# Patient Record
Sex: Female | Born: 1945 | Race: White | Hispanic: No | Marital: Married | Smoking: Never smoker
Health system: Southern US, Community
[De-identification: ages and names within clinical notes are randomized; demographics above are authoritative.]

## PROBLEM LIST (undated history)

## (undated) DIAGNOSIS — N390 Urinary tract infection, site not specified: Secondary | ICD-10-CM

## (undated) DIAGNOSIS — E119 Type 2 diabetes mellitus without complications: Secondary | ICD-10-CM

## (undated) DIAGNOSIS — H3530 Unspecified macular degeneration: Secondary | ICD-10-CM

## (undated) DIAGNOSIS — N200 Calculus of kidney: Secondary | ICD-10-CM

## (undated) DIAGNOSIS — R011 Cardiac murmur, unspecified: Secondary | ICD-10-CM

## (undated) HISTORY — PX: LITHOTRIPSY: SUR834

## (undated) HISTORY — PX: OTHER SURGICAL HISTORY: SHX169

## (undated) HISTORY — DX: Unspecified macular degeneration: H35.30

## (undated) HISTORY — DX: Urinary tract infection, site not specified: N39.0

## (undated) HISTORY — PX: ABDOMINAL HYSTERECTOMY: SHX81

## (undated) HISTORY — DX: Calculus of kidney: N20.0

## (undated) HISTORY — DX: Cardiac murmur, unspecified: R01.1

## (undated) HISTORY — DX: Type 2 diabetes mellitus without complications: E11.9

---

## 2014-05-27 DIAGNOSIS — R6520 Severe sepsis without septic shock: Secondary | ICD-10-CM | POA: Diagnosis not present

## 2014-05-27 DIAGNOSIS — I471 Supraventricular tachycardia: Secondary | ICD-10-CM | POA: Diagnosis not present

## 2014-05-27 DIAGNOSIS — I480 Paroxysmal atrial fibrillation: Secondary | ICD-10-CM | POA: Diagnosis not present

## 2014-05-27 DIAGNOSIS — R197 Diarrhea, unspecified: Secondary | ICD-10-CM | POA: Diagnosis not present

## 2014-05-27 DIAGNOSIS — I499 Cardiac arrhythmia, unspecified: Secondary | ICD-10-CM | POA: Diagnosis not present

## 2014-05-27 DIAGNOSIS — J189 Pneumonia, unspecified organism: Secondary | ICD-10-CM | POA: Diagnosis not present

## 2014-05-27 DIAGNOSIS — Z7901 Long term (current) use of anticoagulants: Secondary | ICD-10-CM | POA: Diagnosis not present

## 2014-05-27 DIAGNOSIS — E871 Hypo-osmolality and hyponatremia: Secondary | ICD-10-CM | POA: Diagnosis not present

## 2014-05-27 DIAGNOSIS — E872 Acidosis: Secondary | ICD-10-CM | POA: Diagnosis not present

## 2014-05-27 DIAGNOSIS — K529 Noninfective gastroenteritis and colitis, unspecified: Secondary | ICD-10-CM | POA: Diagnosis not present

## 2014-05-27 DIAGNOSIS — E860 Dehydration: Secondary | ICD-10-CM | POA: Diagnosis not present

## 2014-05-27 DIAGNOSIS — N179 Acute kidney failure, unspecified: Secondary | ICD-10-CM | POA: Diagnosis not present

## 2014-05-27 DIAGNOSIS — I483 Typical atrial flutter: Secondary | ICD-10-CM | POA: Diagnosis not present

## 2014-05-27 DIAGNOSIS — R0902 Hypoxemia: Secondary | ICD-10-CM | POA: Diagnosis not present

## 2014-05-27 DIAGNOSIS — A419 Sepsis, unspecified organism: Secondary | ICD-10-CM | POA: Diagnosis not present

## 2014-05-27 DIAGNOSIS — J188 Other pneumonia, unspecified organism: Secondary | ICD-10-CM | POA: Diagnosis not present

## 2014-05-27 DIAGNOSIS — R000 Tachycardia, unspecified: Secondary | ICD-10-CM | POA: Diagnosis not present

## 2014-05-27 DIAGNOSIS — I4891 Unspecified atrial fibrillation: Secondary | ICD-10-CM | POA: Diagnosis not present

## 2014-05-27 DIAGNOSIS — R1110 Vomiting, unspecified: Secondary | ICD-10-CM | POA: Diagnosis not present

## 2014-05-27 DIAGNOSIS — R112 Nausea with vomiting, unspecified: Secondary | ICD-10-CM | POA: Diagnosis not present

## 2014-05-27 DIAGNOSIS — I959 Hypotension, unspecified: Secondary | ICD-10-CM | POA: Diagnosis not present

## 2014-06-03 DIAGNOSIS — E871 Hypo-osmolality and hyponatremia: Secondary | ICD-10-CM | POA: Diagnosis not present

## 2014-06-03 DIAGNOSIS — N135 Crossing vessel and stricture of ureter without hydronephrosis: Secondary | ICD-10-CM | POA: Diagnosis not present

## 2014-06-03 DIAGNOSIS — I10 Essential (primary) hypertension: Secondary | ICD-10-CM | POA: Diagnosis not present

## 2014-06-03 DIAGNOSIS — N1330 Unspecified hydronephrosis: Secondary | ICD-10-CM | POA: Diagnosis not present

## 2014-06-03 DIAGNOSIS — K5289 Other specified noninfective gastroenteritis and colitis: Secondary | ICD-10-CM | POA: Diagnosis not present

## 2014-06-03 DIAGNOSIS — Z7901 Long term (current) use of anticoagulants: Secondary | ICD-10-CM | POA: Diagnosis not present

## 2014-06-03 DIAGNOSIS — M2550 Pain in unspecified joint: Secondary | ICD-10-CM | POA: Diagnosis not present

## 2014-06-03 DIAGNOSIS — N201 Calculus of ureter: Secondary | ICD-10-CM | POA: Diagnosis not present

## 2014-06-03 DIAGNOSIS — A419 Sepsis, unspecified organism: Secondary | ICD-10-CM | POA: Diagnosis not present

## 2014-06-03 DIAGNOSIS — Z87442 Personal history of urinary calculi: Secondary | ICD-10-CM | POA: Diagnosis not present

## 2014-06-03 DIAGNOSIS — I4891 Unspecified atrial fibrillation: Secondary | ICD-10-CM | POA: Diagnosis not present

## 2014-06-03 DIAGNOSIS — N179 Acute kidney failure, unspecified: Secondary | ICD-10-CM | POA: Diagnosis not present

## 2014-06-03 DIAGNOSIS — K529 Noninfective gastroenteritis and colitis, unspecified: Secondary | ICD-10-CM | POA: Diagnosis not present

## 2014-06-03 DIAGNOSIS — N132 Hydronephrosis with renal and ureteral calculous obstruction: Secondary | ICD-10-CM | POA: Diagnosis not present

## 2014-06-03 DIAGNOSIS — I5030 Unspecified diastolic (congestive) heart failure: Secondary | ICD-10-CM | POA: Diagnosis not present

## 2014-06-03 DIAGNOSIS — R351 Nocturia: Secondary | ICD-10-CM | POA: Diagnosis not present

## 2014-06-03 DIAGNOSIS — R197 Diarrhea, unspecified: Secondary | ICD-10-CM | POA: Diagnosis not present

## 2014-06-03 DIAGNOSIS — R1110 Vomiting, unspecified: Secondary | ICD-10-CM | POA: Diagnosis not present

## 2014-06-03 DIAGNOSIS — J3089 Other allergic rhinitis: Secondary | ICD-10-CM | POA: Diagnosis not present

## 2014-06-03 DIAGNOSIS — R509 Fever, unspecified: Secondary | ICD-10-CM | POA: Diagnosis not present

## 2014-06-03 DIAGNOSIS — R0600 Dyspnea, unspecified: Secondary | ICD-10-CM | POA: Diagnosis not present

## 2014-06-03 DIAGNOSIS — J189 Pneumonia, unspecified organism: Secondary | ICD-10-CM | POA: Diagnosis not present

## 2014-06-05 DIAGNOSIS — K5289 Other specified noninfective gastroenteritis and colitis: Secondary | ICD-10-CM | POA: Diagnosis not present

## 2014-06-05 DIAGNOSIS — A419 Sepsis, unspecified organism: Secondary | ICD-10-CM | POA: Diagnosis not present

## 2014-06-05 DIAGNOSIS — I4891 Unspecified atrial fibrillation: Secondary | ICD-10-CM | POA: Diagnosis not present

## 2014-06-05 DIAGNOSIS — R509 Fever, unspecified: Secondary | ICD-10-CM | POA: Diagnosis not present

## 2014-06-12 DIAGNOSIS — R0600 Dyspnea, unspecified: Secondary | ICD-10-CM | POA: Diagnosis not present

## 2014-06-12 DIAGNOSIS — I4891 Unspecified atrial fibrillation: Secondary | ICD-10-CM | POA: Diagnosis not present

## 2014-06-12 DIAGNOSIS — R509 Fever, unspecified: Secondary | ICD-10-CM | POA: Diagnosis not present

## 2014-06-12 DIAGNOSIS — J189 Pneumonia, unspecified organism: Secondary | ICD-10-CM | POA: Diagnosis not present

## 2014-06-14 DIAGNOSIS — I4891 Unspecified atrial fibrillation: Secondary | ICD-10-CM | POA: Diagnosis not present

## 2014-06-14 DIAGNOSIS — M2550 Pain in unspecified joint: Secondary | ICD-10-CM | POA: Diagnosis not present

## 2014-06-14 DIAGNOSIS — I10 Essential (primary) hypertension: Secondary | ICD-10-CM | POA: Diagnosis not present

## 2014-06-14 DIAGNOSIS — I5030 Unspecified diastolic (congestive) heart failure: Secondary | ICD-10-CM | POA: Diagnosis not present

## 2014-06-15 DIAGNOSIS — N201 Calculus of ureter: Secondary | ICD-10-CM | POA: Diagnosis not present

## 2014-06-15 DIAGNOSIS — R351 Nocturia: Secondary | ICD-10-CM | POA: Diagnosis not present

## 2014-06-15 DIAGNOSIS — N1330 Unspecified hydronephrosis: Secondary | ICD-10-CM | POA: Diagnosis not present

## 2014-06-16 DIAGNOSIS — R509 Fever, unspecified: Secondary | ICD-10-CM | POA: Diagnosis not present

## 2014-06-16 DIAGNOSIS — K529 Noninfective gastroenteritis and colitis, unspecified: Secondary | ICD-10-CM | POA: Diagnosis not present

## 2014-06-22 DIAGNOSIS — I4891 Unspecified atrial fibrillation: Secondary | ICD-10-CM | POA: Diagnosis not present

## 2014-06-22 DIAGNOSIS — Z7901 Long term (current) use of anticoagulants: Secondary | ICD-10-CM | POA: Diagnosis not present

## 2014-06-22 DIAGNOSIS — N132 Hydronephrosis with renal and ureteral calculous obstruction: Secondary | ICD-10-CM | POA: Diagnosis not present

## 2014-06-22 DIAGNOSIS — Z87442 Personal history of urinary calculi: Secondary | ICD-10-CM | POA: Diagnosis not present

## 2014-06-22 DIAGNOSIS — N135 Crossing vessel and stricture of ureter without hydronephrosis: Secondary | ICD-10-CM | POA: Diagnosis not present

## 2014-06-22 DIAGNOSIS — I10 Essential (primary) hypertension: Secondary | ICD-10-CM | POA: Diagnosis not present

## 2014-06-24 DIAGNOSIS — J3089 Other allergic rhinitis: Secondary | ICD-10-CM | POA: Diagnosis not present

## 2014-06-24 DIAGNOSIS — J189 Pneumonia, unspecified organism: Secondary | ICD-10-CM | POA: Diagnosis not present

## 2014-06-24 DIAGNOSIS — I4891 Unspecified atrial fibrillation: Secondary | ICD-10-CM | POA: Diagnosis not present

## 2014-06-24 DIAGNOSIS — K529 Noninfective gastroenteritis and colitis, unspecified: Secondary | ICD-10-CM | POA: Diagnosis not present

## 2014-06-29 DIAGNOSIS — R0602 Shortness of breath: Secondary | ICD-10-CM | POA: Diagnosis not present

## 2014-06-29 DIAGNOSIS — R079 Chest pain, unspecified: Secondary | ICD-10-CM | POA: Diagnosis not present

## 2014-06-29 DIAGNOSIS — I4891 Unspecified atrial fibrillation: Secondary | ICD-10-CM | POA: Diagnosis not present

## 2014-07-02 DIAGNOSIS — N1330 Unspecified hydronephrosis: Secondary | ICD-10-CM | POA: Diagnosis not present

## 2014-07-02 DIAGNOSIS — B952 Enterococcus as the cause of diseases classified elsewhere: Secondary | ICD-10-CM | POA: Diagnosis not present

## 2014-07-02 DIAGNOSIS — N201 Calculus of ureter: Secondary | ICD-10-CM | POA: Diagnosis not present

## 2014-07-02 DIAGNOSIS — N390 Urinary tract infection, site not specified: Secondary | ICD-10-CM | POA: Diagnosis not present

## 2014-07-02 DIAGNOSIS — N200 Calculus of kidney: Secondary | ICD-10-CM | POA: Diagnosis not present

## 2014-07-09 DIAGNOSIS — N200 Calculus of kidney: Secondary | ICD-10-CM | POA: Diagnosis not present

## 2014-07-13 DIAGNOSIS — R351 Nocturia: Secondary | ICD-10-CM | POA: Diagnosis not present

## 2014-07-21 DIAGNOSIS — Z888 Allergy status to other drugs, medicaments and biological substances status: Secondary | ICD-10-CM | POA: Diagnosis not present

## 2014-07-21 DIAGNOSIS — Z833 Family history of diabetes mellitus: Secondary | ICD-10-CM | POA: Diagnosis not present

## 2014-07-21 DIAGNOSIS — I10 Essential (primary) hypertension: Secondary | ICD-10-CM | POA: Diagnosis not present

## 2014-07-21 DIAGNOSIS — I4891 Unspecified atrial fibrillation: Secondary | ICD-10-CM | POA: Diagnosis not present

## 2014-07-21 DIAGNOSIS — Z79899 Other long term (current) drug therapy: Secondary | ICD-10-CM | POA: Diagnosis not present

## 2014-07-21 DIAGNOSIS — Z825 Family history of asthma and other chronic lower respiratory diseases: Secondary | ICD-10-CM | POA: Diagnosis not present

## 2014-07-21 DIAGNOSIS — N200 Calculus of kidney: Secondary | ICD-10-CM | POA: Diagnosis not present

## 2014-07-21 DIAGNOSIS — N201 Calculus of ureter: Secondary | ICD-10-CM | POA: Diagnosis not present

## 2014-07-21 DIAGNOSIS — Z87442 Personal history of urinary calculi: Secondary | ICD-10-CM | POA: Diagnosis not present

## 2014-08-05 DIAGNOSIS — N390 Urinary tract infection, site not specified: Secondary | ICD-10-CM | POA: Diagnosis not present

## 2014-08-05 DIAGNOSIS — B952 Enterococcus as the cause of diseases classified elsewhere: Secondary | ICD-10-CM | POA: Diagnosis not present

## 2014-08-05 DIAGNOSIS — N200 Calculus of kidney: Secondary | ICD-10-CM | POA: Diagnosis not present

## 2014-08-06 DIAGNOSIS — N200 Calculus of kidney: Secondary | ICD-10-CM | POA: Diagnosis not present

## 2014-08-13 DIAGNOSIS — N201 Calculus of ureter: Secondary | ICD-10-CM | POA: Diagnosis not present

## 2014-08-16 DIAGNOSIS — H2513 Age-related nuclear cataract, bilateral: Secondary | ICD-10-CM | POA: Diagnosis not present

## 2014-08-16 DIAGNOSIS — H53039 Strabismic amblyopia, unspecified eye: Secondary | ICD-10-CM | POA: Diagnosis not present

## 2014-08-16 DIAGNOSIS — H35363 Drusen (degenerative) of macula, bilateral: Secondary | ICD-10-CM | POA: Diagnosis not present

## 2014-08-31 DIAGNOSIS — N201 Calculus of ureter: Secondary | ICD-10-CM | POA: Diagnosis not present

## 2014-08-31 DIAGNOSIS — N1330 Unspecified hydronephrosis: Secondary | ICD-10-CM | POA: Diagnosis not present

## 2014-08-31 DIAGNOSIS — B952 Enterococcus as the cause of diseases classified elsewhere: Secondary | ICD-10-CM | POA: Diagnosis not present

## 2014-08-31 DIAGNOSIS — N390 Urinary tract infection, site not specified: Secondary | ICD-10-CM | POA: Diagnosis not present

## 2014-08-31 DIAGNOSIS — N200 Calculus of kidney: Secondary | ICD-10-CM | POA: Diagnosis not present

## 2014-09-10 DIAGNOSIS — I483 Typical atrial flutter: Secondary | ICD-10-CM | POA: Diagnosis not present

## 2014-09-13 DIAGNOSIS — B952 Enterococcus as the cause of diseases classified elsewhere: Secondary | ICD-10-CM | POA: Diagnosis not present

## 2014-09-13 DIAGNOSIS — N390 Urinary tract infection, site not specified: Secondary | ICD-10-CM | POA: Diagnosis not present

## 2014-09-15 DIAGNOSIS — Z883 Allergy status to other anti-infective agents status: Secondary | ICD-10-CM | POA: Diagnosis not present

## 2014-09-15 DIAGNOSIS — Z79899 Other long term (current) drug therapy: Secondary | ICD-10-CM | POA: Diagnosis not present

## 2014-09-15 DIAGNOSIS — N201 Calculus of ureter: Secondary | ICD-10-CM | POA: Diagnosis not present

## 2014-09-15 DIAGNOSIS — I4891 Unspecified atrial fibrillation: Secondary | ICD-10-CM | POA: Diagnosis not present

## 2014-09-15 DIAGNOSIS — Z7951 Long term (current) use of inhaled steroids: Secondary | ICD-10-CM | POA: Diagnosis not present

## 2014-09-15 DIAGNOSIS — N200 Calculus of kidney: Secondary | ICD-10-CM | POA: Diagnosis not present

## 2014-09-15 DIAGNOSIS — I10 Essential (primary) hypertension: Secondary | ICD-10-CM | POA: Diagnosis not present

## 2014-09-15 DIAGNOSIS — Z87442 Personal history of urinary calculi: Secondary | ICD-10-CM | POA: Diagnosis not present

## 2014-09-27 DIAGNOSIS — K8020 Calculus of gallbladder without cholecystitis without obstruction: Secondary | ICD-10-CM | POA: Diagnosis not present

## 2014-09-27 DIAGNOSIS — N200 Calculus of kidney: Secondary | ICD-10-CM | POA: Diagnosis not present

## 2014-09-27 DIAGNOSIS — I452 Bifascicular block: Secondary | ICD-10-CM | POA: Diagnosis not present

## 2014-09-27 LAB — LIPID PANEL
Cholesterol: 170 mg/dL (ref 0–200)
HDL: 61 mg/dL (ref 35–70)
LDL Cholesterol: 69 mg/dL
TRIGLYCERIDES: 198 mg/dL — AB (ref 40–160)

## 2014-09-27 LAB — BASIC METABOLIC PANEL
BUN: 17 mg/dL (ref 4–21)
Creatinine: 0.9 mg/dL (ref <=1.1)
GLUCOSE: 132 mg/dL
POTASSIUM: 4.6 mmol/L (ref 3.4–5.3)
SODIUM: 140 mmol/L (ref 137–147)

## 2014-09-27 LAB — HEPATIC FUNCTION PANEL
ALT: 24 U/L (ref 7–35)
AST: 16 U/L (ref 13–35)
Alkaline Phosphatase: 59 U/L (ref 25–125)
BILIRUBIN, TOTAL: 0.7 mg/dL

## 2014-09-27 LAB — CBC AND DIFFERENTIAL
HEMATOCRIT: 46 % (ref 36–46)
Hemoglobin: 15.2 g/dL (ref 12.0–16.0)
Neutrophils Absolute: 5 /uL
Platelets: 279 10*3/uL (ref 150–399)
WBC: 7.5 10^3/mL

## 2014-09-28 DIAGNOSIS — B952 Enterococcus as the cause of diseases classified elsewhere: Secondary | ICD-10-CM | POA: Diagnosis not present

## 2014-09-28 DIAGNOSIS — N201 Calculus of ureter: Secondary | ICD-10-CM | POA: Diagnosis not present

## 2014-09-28 DIAGNOSIS — N200 Calculus of kidney: Secondary | ICD-10-CM | POA: Diagnosis not present

## 2014-09-28 DIAGNOSIS — N390 Urinary tract infection, site not specified: Secondary | ICD-10-CM | POA: Diagnosis not present

## 2014-09-30 DIAGNOSIS — N390 Urinary tract infection, site not specified: Secondary | ICD-10-CM | POA: Diagnosis not present

## 2014-09-30 DIAGNOSIS — B952 Enterococcus as the cause of diseases classified elsewhere: Secondary | ICD-10-CM | POA: Diagnosis not present

## 2014-09-30 DIAGNOSIS — N201 Calculus of ureter: Secondary | ICD-10-CM | POA: Diagnosis not present

## 2014-10-07 DIAGNOSIS — N200 Calculus of kidney: Secondary | ICD-10-CM | POA: Diagnosis not present

## 2014-10-07 DIAGNOSIS — J309 Allergic rhinitis, unspecified: Secondary | ICD-10-CM | POA: Diagnosis not present

## 2014-10-07 DIAGNOSIS — I452 Bifascicular block: Secondary | ICD-10-CM | POA: Diagnosis not present

## 2014-11-01 DIAGNOSIS — N390 Urinary tract infection, site not specified: Secondary | ICD-10-CM | POA: Diagnosis not present

## 2014-11-01 DIAGNOSIS — N201 Calculus of ureter: Secondary | ICD-10-CM | POA: Diagnosis not present

## 2014-11-01 DIAGNOSIS — N200 Calculus of kidney: Secondary | ICD-10-CM | POA: Diagnosis not present

## 2014-11-01 DIAGNOSIS — B952 Enterococcus as the cause of diseases classified elsewhere: Secondary | ICD-10-CM | POA: Diagnosis not present

## 2014-11-17 DIAGNOSIS — Z79899 Other long term (current) drug therapy: Secondary | ICD-10-CM | POA: Diagnosis not present

## 2014-11-17 DIAGNOSIS — Z7951 Long term (current) use of inhaled steroids: Secondary | ICD-10-CM | POA: Diagnosis not present

## 2014-11-17 DIAGNOSIS — N200 Calculus of kidney: Secondary | ICD-10-CM | POA: Diagnosis not present

## 2014-11-17 DIAGNOSIS — I10 Essential (primary) hypertension: Secondary | ICD-10-CM | POA: Diagnosis not present

## 2014-11-17 DIAGNOSIS — E669 Obesity, unspecified: Secondary | ICD-10-CM | POA: Diagnosis not present

## 2014-11-17 DIAGNOSIS — Z6830 Body mass index (BMI) 30.0-30.9, adult: Secondary | ICD-10-CM | POA: Diagnosis not present

## 2014-11-17 DIAGNOSIS — Z883 Allergy status to other anti-infective agents status: Secondary | ICD-10-CM | POA: Diagnosis not present

## 2014-11-17 DIAGNOSIS — Z87442 Personal history of urinary calculi: Secondary | ICD-10-CM | POA: Diagnosis not present

## 2014-11-17 DIAGNOSIS — I4891 Unspecified atrial fibrillation: Secondary | ICD-10-CM | POA: Diagnosis not present

## 2014-12-02 DIAGNOSIS — N390 Urinary tract infection, site not specified: Secondary | ICD-10-CM | POA: Diagnosis not present

## 2014-12-02 DIAGNOSIS — N1330 Unspecified hydronephrosis: Secondary | ICD-10-CM | POA: Diagnosis not present

## 2014-12-02 DIAGNOSIS — N200 Calculus of kidney: Secondary | ICD-10-CM | POA: Diagnosis not present

## 2014-12-02 DIAGNOSIS — B952 Enterococcus as the cause of diseases classified elsewhere: Secondary | ICD-10-CM | POA: Diagnosis not present

## 2014-12-30 DIAGNOSIS — N1330 Unspecified hydronephrosis: Secondary | ICD-10-CM | POA: Diagnosis not present

## 2014-12-30 DIAGNOSIS — Z87440 Personal history of urinary (tract) infections: Secondary | ICD-10-CM | POA: Diagnosis not present

## 2015-02-25 DIAGNOSIS — Z8619 Personal history of other infectious and parasitic diseases: Secondary | ICD-10-CM | POA: Diagnosis not present

## 2015-02-25 DIAGNOSIS — Z8701 Personal history of pneumonia (recurrent): Secondary | ICD-10-CM | POA: Diagnosis not present

## 2015-02-25 DIAGNOSIS — R0981 Nasal congestion: Secondary | ICD-10-CM | POA: Diagnosis not present

## 2015-02-25 DIAGNOSIS — R05 Cough: Secondary | ICD-10-CM | POA: Diagnosis not present

## 2015-02-25 DIAGNOSIS — J069 Acute upper respiratory infection, unspecified: Secondary | ICD-10-CM | POA: Diagnosis not present

## 2015-03-22 DIAGNOSIS — I483 Typical atrial flutter: Secondary | ICD-10-CM | POA: Diagnosis not present

## 2015-06-23 DIAGNOSIS — N200 Calculus of kidney: Secondary | ICD-10-CM | POA: Diagnosis not present

## 2015-06-28 DIAGNOSIS — N3943 Post-void dribbling: Secondary | ICD-10-CM | POA: Diagnosis not present

## 2015-06-28 DIAGNOSIS — B952 Enterococcus as the cause of diseases classified elsewhere: Secondary | ICD-10-CM | POA: Diagnosis not present

## 2015-06-28 DIAGNOSIS — N200 Calculus of kidney: Secondary | ICD-10-CM | POA: Diagnosis not present

## 2015-06-28 DIAGNOSIS — N390 Urinary tract infection, site not specified: Secondary | ICD-10-CM | POA: Diagnosis not present

## 2015-06-28 DIAGNOSIS — Z87440 Personal history of urinary (tract) infections: Secondary | ICD-10-CM | POA: Diagnosis not present

## 2015-06-29 DIAGNOSIS — H04123 Dry eye syndrome of bilateral lacrimal glands: Secondary | ICD-10-CM | POA: Diagnosis not present

## 2015-06-29 DIAGNOSIS — H0289 Other specified disorders of eyelid: Secondary | ICD-10-CM | POA: Diagnosis not present

## 2015-06-30 DIAGNOSIS — Z1231 Encounter for screening mammogram for malignant neoplasm of breast: Secondary | ICD-10-CM | POA: Diagnosis not present

## 2015-06-30 LAB — HM MAMMOGRAPHY: HM MAMMO: NORMAL (ref 0–4)

## 2015-07-07 ENCOUNTER — Ambulatory Visit: Payer: Self-pay | Admitting: Family Medicine

## 2015-07-20 DIAGNOSIS — N390 Urinary tract infection, site not specified: Secondary | ICD-10-CM | POA: Diagnosis not present

## 2015-07-20 DIAGNOSIS — Z87440 Personal history of urinary (tract) infections: Secondary | ICD-10-CM | POA: Diagnosis not present

## 2015-07-20 DIAGNOSIS — B952 Enterococcus as the cause of diseases classified elsewhere: Secondary | ICD-10-CM | POA: Diagnosis not present

## 2015-07-25 ENCOUNTER — Ambulatory Visit: Payer: Self-pay | Admitting: Family Medicine

## 2015-08-15 DIAGNOSIS — H353131 Nonexudative age-related macular degeneration, bilateral, early dry stage: Secondary | ICD-10-CM | POA: Diagnosis not present

## 2015-08-15 DIAGNOSIS — H0289 Other specified disorders of eyelid: Secondary | ICD-10-CM | POA: Diagnosis not present

## 2015-08-15 DIAGNOSIS — H2513 Age-related nuclear cataract, bilateral: Secondary | ICD-10-CM | POA: Diagnosis not present

## 2015-08-15 DIAGNOSIS — H53039 Strabismic amblyopia, unspecified eye: Secondary | ICD-10-CM | POA: Diagnosis not present

## 2015-08-15 DIAGNOSIS — H40033 Anatomical narrow angle, bilateral: Secondary | ICD-10-CM | POA: Diagnosis not present

## 2015-08-19 ENCOUNTER — Ambulatory Visit (INDEPENDENT_AMBULATORY_CARE_PROVIDER_SITE_OTHER): Payer: Medicare Other | Admitting: Family Medicine

## 2015-08-19 ENCOUNTER — Encounter: Payer: Self-pay | Admitting: Family Medicine

## 2015-08-19 ENCOUNTER — Encounter: Payer: Self-pay | Admitting: General Practice

## 2015-08-19 VITALS — BP 138/86 | HR 82 | Temp 98.0°F | Resp 16 | Ht 63.0 in | Wt 192.5 lb

## 2015-08-19 DIAGNOSIS — Z87442 Personal history of urinary calculi: Secondary | ICD-10-CM

## 2015-08-19 DIAGNOSIS — Z8719 Personal history of other diseases of the digestive system: Secondary | ICD-10-CM

## 2015-08-19 DIAGNOSIS — E669 Obesity, unspecified: Secondary | ICD-10-CM | POA: Insufficient documentation

## 2015-08-19 NOTE — Progress Notes (Signed)
   Subjective:    Patient ID: Sandra Bailey, female    DOB: [birth year], 70 y.o.   MRN: [id number]  HPI New to establish.  Previous MD- Tommie Sams  Obesity- pt's BMI 34.  No CP, SOB, HAs, edema, abd pain, N/V.  Pt is attempting regular exercise- she used to take Karate and go to the gym but she has not been recently.  Hx of kidney stones- pt developed sepsis due to kidney stone in April 2015.  Now seeing Dr Joya Gaskins.  Had lithotripsy x3 in 2016.  Hx of gallstones- pt reports she has had these on imaging for years but has been asymptomatic.    Health Maintenance- UTD on mammo, has never had colonoscopy- pt will not have one.  Open to idea of cologuard.  Pt got pneumovax in 2015.   Review of Systems For ROS see HPI     Objective:   Physical Exam  Constitutional: She is oriented to person, place, and time. She appears well-developed and well-nourished. No distress.  obese  HENT:  Head: Normocephalic and atraumatic.  Eyes: Conjunctivae and EOM are normal. Pupils are equal, round, and reactive to light.  Neck: Normal range of motion. Neck supple. No thyromegaly present.  Cardiovascular: Normal rate, regular rhythm, normal heart sounds and intact distal pulses.   No murmur heard. Pulmonary/Chest: Effort normal and breath sounds normal. No respiratory distress.  Abdominal: Soft. She exhibits no distension. There is no tenderness.  Musculoskeletal: She exhibits no edema.  Lymphadenopathy:    She has no cervical adenopathy.  Neurological: She is alert and oriented to person, place, and time.  Skin: Skin is warm and dry.  Psychiatric: She has a normal mood and affect. Her behavior is normal.  Vitals reviewed.         Assessment & Plan:

## 2015-08-19 NOTE — Progress Notes (Signed)
Pre visit review using our clinic review tool, if applicable. No additional management support is needed unless otherwise documented below in the visit note. 

## 2015-08-19 NOTE — Assessment & Plan Note (Signed)
New to provider, ongoing for pt.  She reports this is seen on imaging but has never been symptomatic.  Will get LFTs and continue to follow.

## 2015-08-19 NOTE — Assessment & Plan Note (Signed)
New to provider, ongoing for pt.  Stressed need for low carb diet, increased fruits and veggies, elimination of sugary drinks.  Also discussed need for regular exercise.  Check labs to risk stratify.  Will follow.

## 2015-08-19 NOTE — Patient Instructions (Signed)
Schedule a lab visit for next week (9am-11:15am) We'll notify you of your lab results and make any changes if needed Continue to work on healthy diet and regular exercise- you can do it!!! Complete the cologuard as directed Call with any questions or concerns Welcome!  We're glad to have you!!!

## 2015-08-19 NOTE — Assessment & Plan Note (Signed)
New to provider, ongoing for pt.  Following w/ Urology in [place].  Currently asymptomatic after lithotripsy x3 but pt aware that she has fragments remaining.  Will continue to follow along.

## 2015-08-25 ENCOUNTER — Other Ambulatory Visit (INDEPENDENT_AMBULATORY_CARE_PROVIDER_SITE_OTHER): Payer: Medicare Other

## 2015-08-25 DIAGNOSIS — E669 Obesity, unspecified: Secondary | ICD-10-CM | POA: Diagnosis not present

## 2015-08-25 LAB — CBC WITH DIFFERENTIAL/PLATELET
BASOS ABS: 0.1 10*3/uL (ref 0.0–0.1)
Basophils Relative: 1.1 % (ref 0.0–3.0)
EOS PCT: 6 % — AB (ref 0.0–5.0)
Eosinophils Absolute: 0.5 10*3/uL (ref 0.0–0.7)
HCT: 46.6 % — ABNORMAL HIGH (ref 36.0–46.0)
HEMOGLOBIN: 15.7 g/dL — AB (ref 12.0–15.0)
LYMPHS ABS: 2.5 10*3/uL (ref 0.7–4.0)
LYMPHS PCT: 31.3 % (ref 12.0–46.0)
MCHC: 33.7 g/dL (ref 30.0–36.0)
MCV: 92.3 fl (ref 78.0–100.0)
MONOS PCT: 8.4 % (ref 3.0–12.0)
Monocytes Absolute: 0.7 10*3/uL (ref 0.1–1.0)
NEUTROS PCT: 53.2 % (ref 43.0–77.0)
Neutro Abs: 4.2 10*3/uL (ref 1.4–7.7)
Platelets: 284 10*3/uL (ref 150.0–400.0)
RBC: 5.05 Mil/uL (ref 3.87–5.11)
RDW: 13.1 % (ref 11.5–15.5)
WBC: 7.9 10*3/uL (ref 4.0–10.5)

## 2015-08-25 LAB — BASIC METABOLIC PANEL
BUN: 16 mg/dL (ref 6–23)
CHLORIDE: 102 meq/L (ref 96–112)
CO2: 30 meq/L (ref 19–32)
CREATININE: 0.88 mg/dL (ref 0.40–1.20)
Calcium: 9.7 mg/dL (ref 8.4–10.5)
GFR: 67.36 mL/min (ref >60.00)
Glucose, Bld: 152 mg/dL — ABNORMAL HIGH (ref 70–99)
POTASSIUM: 4.1 meq/L (ref 3.5–5.1)
Sodium: 136 mEq/L (ref 135–145)

## 2015-08-25 LAB — HEPATIC FUNCTION PANEL
ALBUMIN: 4.3 g/dL (ref 3.5–5.2)
ALK PHOS: 52 U/L (ref 39–117)
ALT: 37 U/L — ABNORMAL HIGH (ref 0–35)
AST: 23 U/L (ref 0–37)
BILIRUBIN DIRECT: 0.2 mg/dL (ref 0.0–0.3)
BILIRUBIN TOTAL: 1.1 mg/dL (ref 0.2–1.2)
TOTAL PROTEIN: 7.4 g/dL (ref 6.0–8.3)

## 2015-08-25 LAB — LIPID PANEL
CHOLESTEROL: 162 mg/dL (ref 0–200)
HDL: 59 mg/dL (ref >39.00)
NONHDL: 102.79
TRIGLYCERIDES: 214 mg/dL — AB (ref 0.0–149.0)
Total CHOL/HDL Ratio: 3
VLDL: 42.8 mg/dL — ABNORMAL HIGH (ref 0.0–40.0)

## 2015-08-25 LAB — LDL CHOLESTEROL, DIRECT: Direct LDL: 93 mg/dL

## 2015-08-26 ENCOUNTER — Other Ambulatory Visit (INDEPENDENT_AMBULATORY_CARE_PROVIDER_SITE_OTHER): Payer: Medicare Other

## 2015-08-26 DIAGNOSIS — R7309 Other abnormal glucose: Secondary | ICD-10-CM

## 2015-08-26 LAB — HEMOGLOBIN A1C: HEMOGLOBIN A1C: 6.7 % — AB (ref 4.6–6.5)

## 2015-09-01 DIAGNOSIS — H353132 Nonexudative age-related macular degeneration, bilateral, intermediate dry stage: Secondary | ICD-10-CM | POA: Diagnosis not present

## 2015-09-08 DIAGNOSIS — J069 Acute upper respiratory infection, unspecified: Secondary | ICD-10-CM | POA: Diagnosis not present

## 2015-09-13 DIAGNOSIS — R05 Cough: Secondary | ICD-10-CM | POA: Diagnosis not present

## 2015-09-23 ENCOUNTER — Telehealth: Payer: Self-pay | Admitting: Family Medicine

## 2015-09-23 DIAGNOSIS — I77810 Thoracic aortic ectasia: Secondary | ICD-10-CM | POA: Diagnosis not present

## 2015-09-23 NOTE — Telephone Encounter (Signed)
FYI

## 2015-09-23 NOTE — Telephone Encounter (Signed)
FYI:  Patient states she had a CT scan of chest performed today.  This was ordered by her cardiologist Dr. Gibson Ramp.  She states she asked them to fax a copy to pcp so this can be put in her chart.

## 2015-09-29 LAB — HM DIABETES EYE EXAM

## 2015-10-11 DIAGNOSIS — Z1212 Encounter for screening for malignant neoplasm of rectum: Secondary | ICD-10-CM | POA: Diagnosis not present

## 2015-10-11 DIAGNOSIS — Z1211 Encounter for screening for malignant neoplasm of colon: Secondary | ICD-10-CM | POA: Diagnosis not present

## 2015-10-11 LAB — COLOGUARD: Cologuard: NEGATIVE

## 2015-10-13 ENCOUNTER — Telehealth: Payer: Self-pay | Admitting: Family Medicine

## 2015-10-13 NOTE — Telephone Encounter (Signed)
Pt states that she is needing some shots pneumonia, shingles, flu, and tetanus. Pt states she is also due for her bone density test. Pt states that she was in the [place] back in 2016 and not sure if she received some of these shots there. She is having records faxed to [place]. Pt states that what ever is needed can she go to [facility] [phone] in [place] due to this being closer to home.

## 2015-10-13 NOTE — Telephone Encounter (Signed)
Left detailed message on machine per DPR of able to get shingles and Tdap at her local pharmacy, she can get flu and pneumonia vaccines at her next appointment and will discuss her mammogram and bone density at the same time.

## 2015-10-13 NOTE — Telephone Encounter (Signed)
It pt would like a shingles and Tdap, this will need to be done at the pharmacy based on insurance. We can do her flu and pneumonia shots when she comes back in September We will discuss mammo and bone density at her upcoming appts

## 2015-10-13 NOTE — Telephone Encounter (Signed)
Left message on machine to return call see annotation below.

## 2015-10-21 ENCOUNTER — Encounter: Payer: Self-pay | Admitting: General Practice

## 2015-11-02 DIAGNOSIS — L814 Other melanin hyperpigmentation: Secondary | ICD-10-CM | POA: Diagnosis not present

## 2015-11-02 DIAGNOSIS — L570 Actinic keratosis: Secondary | ICD-10-CM | POA: Diagnosis not present

## 2015-11-30 ENCOUNTER — Encounter: Payer: Self-pay | Admitting: Family Medicine

## 2015-11-30 ENCOUNTER — Ambulatory Visit (INDEPENDENT_AMBULATORY_CARE_PROVIDER_SITE_OTHER): Payer: Medicare Other | Admitting: Family Medicine

## 2015-11-30 VITALS — BP 128/82 | HR 86 | Temp 98.3°F | Resp 16 | Ht 63.0 in | Wt 185.2 lb

## 2015-11-30 DIAGNOSIS — Z23 Encounter for immunization: Secondary | ICD-10-CM

## 2015-11-30 DIAGNOSIS — E119 Type 2 diabetes mellitus without complications: Secondary | ICD-10-CM

## 2015-11-30 LAB — BASIC METABOLIC PANEL
BUN: 13 mg/dL (ref 6–23)
CO2: 29 meq/L (ref 19–32)
Calcium: 9.3 mg/dL (ref 8.4–10.5)
Chloride: 104 mEq/L (ref 96–112)
Creatinine, Ser: 0.79 mg/dL (ref 0.40–1.20)
GFR: 76.24 mL/min (ref >60.00)
GLUCOSE: 119 mg/dL — AB (ref 70–99)
POTASSIUM: 4.5 meq/L (ref 3.5–5.1)
SODIUM: 141 meq/L (ref 135–145)

## 2015-11-30 LAB — MICROALBUMIN / CREATININE URINE RATIO
Creatinine,U: 45 mg/dL
MICROALB/CREAT RATIO: 0.9 mg/g (ref 0.0–30.0)
Microalb, Ur: 0.4 mg/dL (ref 0.0–1.9)

## 2015-11-30 LAB — HEMOGLOBIN A1C: Hgb A1c MFr Bld: 6.4 % (ref 4.6–6.5)

## 2015-11-30 NOTE — Progress Notes (Signed)
Pre visit review using our clinic review tool, if applicable. No additional management support is needed unless otherwise documented below in the visit note. 

## 2015-11-30 NOTE — Patient Instructions (Signed)
Follow up in 3-4 months to recheck sugar We'll notify you of your lab results and make any changes if needed Keep up the good work on healthy diet and regular exercise- you look great!!!! Have your eye doctor send me a copy of your report Call with any questions or concerns Happy Fall!!!

## 2015-11-30 NOTE — Assessment & Plan Note (Signed)
New dx at last visit based on elevated sugars.  Pt has lost 8 lbs since last visit.  Applauded her efforts.  UTD on eye exam.  Foot exam done today.  Microalbumin collected.  Check labs.  Adjust tx prn

## 2015-11-30 NOTE — Progress Notes (Signed)
   Subjective:    Patient ID: Sandra Bailey, female    DOB: May 25, 1945, 70 y.o.   MRN: [id number]  HPI DM- new dx at last visit based on A1C of 6.7  She is down 8 lbs since last visit.  Not currently on meds.  Due for microalbumin, foot exam.  UTD on eye exam (July 2017- Dr Garwin Brothers).  No CP, SOB, HAs, edema.  No numbness/tingling of hands/feet.  Exercising regularly, has changed diet.   Review of Systems For ROS see HPI     Objective:   Physical Exam  Constitutional: She is oriented to person, place, and time. She appears well-developed and well-nourished. No distress.  HENT:  Head: Normocephalic and atraumatic.  Eyes: Conjunctivae and EOM are normal. Pupils are equal, round, and reactive to light.  Neck: Normal range of motion. Neck supple. No thyromegaly present.  Cardiovascular: Normal rate, regular rhythm, normal heart sounds and intact distal pulses.   No murmur heard. Pulmonary/Chest: Effort normal and breath sounds normal. No respiratory distress.  Abdominal: Soft. She exhibits no distension. There is no tenderness.  Musculoskeletal: She exhibits no edema.  Lymphadenopathy:    She has no cervical adenopathy.  Neurological: She is alert and oriented to person, place, and time.  Skin: Skin is warm and dry.  Psychiatric: She has a normal mood and affect. Her behavior is normal.  Vitals reviewed.         Assessment & Plan:

## 2016-01-12 DIAGNOSIS — Z780 Asymptomatic menopausal state: Secondary | ICD-10-CM | POA: Diagnosis not present

## 2016-01-12 DIAGNOSIS — Z13820 Encounter for screening for osteoporosis: Secondary | ICD-10-CM | POA: Diagnosis not present

## 2016-01-12 LAB — HM DEXA SCAN

## 2016-01-30 DIAGNOSIS — L820 Inflamed seborrheic keratosis: Secondary | ICD-10-CM | POA: Diagnosis not present

## 2016-01-30 DIAGNOSIS — L821 Other seborrheic keratosis: Secondary | ICD-10-CM | POA: Diagnosis not present

## 2016-01-30 DIAGNOSIS — D485 Neoplasm of uncertain behavior of skin: Secondary | ICD-10-CM | POA: Diagnosis not present

## 2016-01-30 DIAGNOSIS — L570 Actinic keratosis: Secondary | ICD-10-CM | POA: Diagnosis not present

## 2016-02-29 ENCOUNTER — Encounter: Payer: Self-pay | Admitting: Family Medicine

## 2016-02-29 ENCOUNTER — Ambulatory Visit (INDEPENDENT_AMBULATORY_CARE_PROVIDER_SITE_OTHER): Payer: Medicare Other | Admitting: Family Medicine

## 2016-02-29 VITALS — BP 130/84 | HR 82 | Temp 98.1°F | Resp 16 | Ht 63.0 in | Wt 188.0 lb

## 2016-02-29 DIAGNOSIS — E119 Type 2 diabetes mellitus without complications: Secondary | ICD-10-CM | POA: Diagnosis not present

## 2016-02-29 LAB — HEPATIC FUNCTION PANEL
ALBUMIN: 4.2 g/dL (ref 3.5–5.2)
ALT: 22 U/L (ref 0–35)
AST: 15 U/L (ref 0–37)
Alkaline Phosphatase: 64 U/L (ref 39–117)
BILIRUBIN DIRECT: 0.1 mg/dL (ref 0.0–0.3)
TOTAL PROTEIN: 6.8 g/dL (ref 6.0–8.3)
Total Bilirubin: 1.1 mg/dL (ref 0.2–1.2)

## 2016-02-29 LAB — BASIC METABOLIC PANEL
BUN: 13 mg/dL (ref 6–23)
CALCIUM: 9.4 mg/dL (ref 8.4–10.5)
CHLORIDE: 102 meq/L (ref 96–112)
CO2: 28 meq/L (ref 19–32)
Creatinine, Ser: 0.83 mg/dL (ref 0.40–1.20)
GFR: 71.96 mL/min (ref >60.00)
GLUCOSE: 121 mg/dL — AB (ref 70–99)
POTASSIUM: 4.6 meq/L (ref 3.5–5.1)
SODIUM: 139 meq/L (ref 135–145)

## 2016-02-29 LAB — LIPID PANEL
CHOL/HDL RATIO: 3
Cholesterol: 163 mg/dL (ref 0–200)
HDL: 59.3 mg/dL (ref >39.00)
NONHDL: 103.78
TRIGLYCERIDES: 245 mg/dL — AB (ref 0.0–149.0)
VLDL: 49 mg/dL — AB (ref 0.0–40.0)

## 2016-02-29 LAB — LDL CHOLESTEROL, DIRECT: LDL DIRECT: 88 mg/dL

## 2016-02-29 LAB — HEMOGLOBIN A1C: HEMOGLOBIN A1C: 6.4 % (ref 4.6–6.5)

## 2016-02-29 LAB — TSH: TSH: 3.15 u[IU]/mL (ref 0.35–4.50)

## 2016-02-29 NOTE — Progress Notes (Signed)
Pre visit review using our clinic review tool, if applicable. No additional management support is needed unless otherwise documented below in the visit note. 

## 2016-02-29 NOTE — Assessment & Plan Note (Signed)
Chronic problem.  Pt admits to poor holiday eating but plans to get back on track.  UTD on foot exam, eye exam, microalbumin.  Check labs.  Start meds prn.

## 2016-02-29 NOTE — Progress Notes (Signed)
   Subjective:    Patient ID: Sandra Bailey, female    DOB: Sep 26, 1945, 70 y.o.   MRN: [id number]  HPI DM- chronic problem, currently diet controlled.  UTD on foot exam, eye exam, and microalbumin.  Last A1C 6.4  Denies symptomatic lows.  Admits to poor eating and lack of exercise.  [facility]  No CP, SOB, HAs, visual changes, edema.  Denies numbness/tingling of hands/feet.  No abd pain, N/V.   Review of Systems For ROS see HPI     Objective:   Physical Exam  Constitutional: She is oriented to person, place, and time. She appears well-developed and well-nourished. No distress.  HENT:  Head: Normocephalic and atraumatic.  Eyes: Conjunctivae and EOM are normal. Pupils are equal, round, and reactive to light.  Neck: Normal range of motion. Neck supple. No thyromegaly present.  Cardiovascular: Normal rate, regular rhythm, normal heart sounds and intact distal pulses.   No murmur heard. Pulmonary/Chest: Effort normal and breath sounds normal. No respiratory distress.  Abdominal: Soft. She exhibits no distension. There is no tenderness.  Musculoskeletal: She exhibits no edema.  Lymphadenopathy:    She has no cervical adenopathy.  Neurological: She is alert and oriented to person, place, and time.  Skin: Skin is warm and dry.  Psychiatric: She has a normal mood and affect. Her behavior is normal.  Vitals reviewed.         Assessment & Plan:

## 2016-02-29 NOTE — Patient Instructions (Signed)
Schedule your complete physical in 3-4 months We'll notify you of your lab results and make any changes if needed Try and restart your exercise and make healthy food choices- you can do it! Call with any questions or concerns Happy New Year!!!

## 2016-03-07 DIAGNOSIS — I483 Typical atrial flutter: Secondary | ICD-10-CM | POA: Diagnosis not present

## 2016-03-13 ENCOUNTER — Encounter: Payer: Self-pay | Admitting: General Practice

## 2016-03-13 ENCOUNTER — Telehealth: Payer: Self-pay | Admitting: General Practice

## 2016-03-13 NOTE — Telephone Encounter (Signed)
Called pt to give results of bone density: Osteoporosis. Phone hung up on me twice.    PCP would like to know if pt would like to start either fosamax 70mg  weekly or Prolia injections every 6 months in conjunction with Calcium 1200mg  and Vitamin D 800iu daily.

## 2016-03-13 NOTE — Telephone Encounter (Signed)
Spoke with pt she would like to proceed with prolia, stated she had tried and failed fosamax in the past, it did not work for her. Prolia paperwork started today.

## 2016-03-15 DIAGNOSIS — H353132 Nonexudative age-related macular degeneration, bilateral, intermediate dry stage: Secondary | ICD-10-CM | POA: Diagnosis not present

## 2016-03-15 NOTE — Telephone Encounter (Signed)
Voicemail retrieved from phone at front desk.  1/11//18 @ 2:28pm.  Patient states someone called her regarding her bone density results, she wants to know if we have heard back from insurance company yet.  She was asleep when she answered the call and can not remember what she was told.

## 2016-03-15 NOTE — Telephone Encounter (Signed)
Called and LMOVM for pt to inform that this process can take 2-3 weeks. We will call her once everything has been approved and the medication is in the office. At that time she will be informed and contacted to schedule a nurse visit.

## 2016-03-27 ENCOUNTER — Encounter: Payer: Self-pay | Admitting: General Practice

## 2016-03-28 ENCOUNTER — Ambulatory Visit (INDEPENDENT_AMBULATORY_CARE_PROVIDER_SITE_OTHER): Payer: Medicare Other | Admitting: *Deleted

## 2016-03-28 DIAGNOSIS — M810 Age-related osteoporosis without current pathological fracture: Secondary | ICD-10-CM

## 2016-03-28 MED ORDER — DENOSUMAB 60 MG/ML ~~LOC~~ SOLN
60.0000 mg | Freq: Once | SUBCUTANEOUS | Status: AC
  Administered 2016-03-28: 60 mg via SUBCUTANEOUS

## 2016-05-25 ENCOUNTER — Other Ambulatory Visit: Payer: Self-pay | Admitting: Physician Assistant

## 2016-05-25 ENCOUNTER — Ambulatory Visit (INDEPENDENT_AMBULATORY_CARE_PROVIDER_SITE_OTHER): Payer: Medicare Other | Admitting: Physician Assistant

## 2016-05-25 ENCOUNTER — Encounter: Payer: Self-pay | Admitting: Physician Assistant

## 2016-05-25 ENCOUNTER — Ambulatory Visit (INDEPENDENT_AMBULATORY_CARE_PROVIDER_SITE_OTHER): Payer: Medicare Other

## 2016-05-25 VITALS — BP 120/76 | HR 78 | Temp 98.4°F | Resp 14 | Ht 63.0 in | Wt 182.0 lb

## 2016-05-25 DIAGNOSIS — R509 Fever, unspecified: Secondary | ICD-10-CM

## 2016-05-25 DIAGNOSIS — R10816 Epigastric abdominal tenderness: Secondary | ICD-10-CM

## 2016-05-25 DIAGNOSIS — R918 Other nonspecific abnormal finding of lung field: Secondary | ICD-10-CM | POA: Diagnosis not present

## 2016-05-25 DIAGNOSIS — R8290 Unspecified abnormal findings in urine: Secondary | ICD-10-CM

## 2016-05-25 LAB — COMPREHENSIVE METABOLIC PANEL
ALBUMIN: 4 g/dL (ref 3.5–5.2)
ALK PHOS: 43 U/L (ref 39–117)
ALT: 41 U/L — ABNORMAL HIGH (ref 0–35)
AST: 30 U/L (ref 0–37)
BUN: 11 mg/dL (ref 6–23)
CHLORIDE: 98 meq/L (ref 96–112)
CO2: 25 mEq/L (ref 19–32)
Calcium: 8.2 mg/dL — ABNORMAL LOW (ref 8.4–10.5)
Creatinine, Ser: 0.81 mg/dL (ref 0.40–1.20)
GFR: 73.97 mL/min (ref >60.00)
Glucose, Bld: 145 mg/dL — ABNORMAL HIGH (ref 70–99)
POTASSIUM: 4 meq/L (ref 3.5–5.1)
Sodium: 133 mEq/L — ABNORMAL LOW (ref 135–145)
TOTAL PROTEIN: 6.7 g/dL (ref 6.0–8.3)
Total Bilirubin: 0.7 mg/dL (ref 0.2–1.2)

## 2016-05-25 LAB — POCT URINALYSIS DIPSTICK
Bilirubin, UA: NEGATIVE
Glucose, UA: NEGATIVE
NITRITE UA: NEGATIVE
PH UA: 6 (ref 5.0–8.0)
PROTEIN UA: NEGATIVE
Spec Grav, UA: 1.02 (ref 1.030–1.035)
Urobilinogen, UA: 0.2 (ref ?–2.0)

## 2016-05-25 LAB — CBC
HEMATOCRIT: 45.4 % (ref 36.0–46.0)
Hemoglobin: 15.5 g/dL — ABNORMAL HIGH (ref 12.0–15.0)
MCHC: 34.2 g/dL (ref 30.0–36.0)
MCV: 92 fl (ref 78.0–100.0)
PLATELETS: 180 10*3/uL (ref 150.0–400.0)
RBC: 4.94 Mil/uL (ref 3.87–5.11)
RDW: 13.5 % (ref 11.5–15.5)
WBC: 4 10*3/uL (ref 4.0–10.5)

## 2016-05-25 LAB — LIPASE: LIPASE: 28 U/L (ref 11.0–59.0)

## 2016-05-25 LAB — POCT INFLUENZA A: Rapid Influenza A Ag: NEGATIVE

## 2016-05-25 MED ORDER — ONDANSETRON HCL 4 MG PO TABS: 4.0000 mg | ORAL_TABLET | Freq: Three times a day (TID) | ORAL | 0 refills | Status: DC | PRN

## 2016-05-25 MED ORDER — BENZONATATE 100 MG PO CAPS: 100.0000 mg | ORAL_CAPSULE | Freq: Two times a day (BID) | ORAL | 0 refills | Status: AC | PRN

## 2016-05-25 MED ORDER — AZITHROMYCIN 250 MG PO TABS: ORAL_TABLET | ORAL | 0 refills | Status: AC

## 2016-05-25 NOTE — Patient Instructions (Signed)
Please go to the lab for blood work.  Then go to the [facility] office for imaging.  We will call with results and next steps.  Keep hydrating. Follow the diet below.  Zofran as directed for nausea. If you are unable to keep fluids down or food on your stomach, you need to go to the ER. Also if any symptoms worsen over the weekend.

## 2016-05-25 NOTE — Progress Notes (Signed)
Pre visit review using our clinic review tool, if applicable. No additional management support is needed unless otherwise documented below in the visit note. 

## 2016-05-25 NOTE — Progress Notes (Signed)
Patient presents to clinic today c/o URI symptoms starting abruptly last Saturday with aches, chills, cough, fatigue and fever. Noted fever at 99-100. Endorses fatigue since that time. Is still having cough that is now productive and post-nasal drip, now with nausea from drainage. Denies emesis. Notes sinus pressure and sinus pain that have resolved. Denies chest pain but has been feeling chest tightness. Has be hydrating well. Has been eating some bland foods only due to nausea. Has noted loose consistency of stools without frequency. Denies melena, tenesmus, hematochezia.  Past Medical History:  Diagnosis Date  . Heart murmur   . Kidney stones   . Macular degeneration   . UTI (lower urinary tract infection)     Current Outpatient Prescriptions on File Prior to Visit  Medication Sig Dispense Refill  . Carboxymethylcellulose Sodium (EQ RESTORE PLUS LUBRICANT EYE OP) Apply to eye.    . Cranberry 500 MG CAPS Take by mouth.    . Eyelid Cleansers (SYSTANE LID WIPES EX) Apply topically.    . Multiple Vitamins-Minerals (PRESERVISION AREDS 2) CAPS Take 2 capsules by mouth daily.     . Omega-3 Fatty Acids (FISH OIL) 1000 MG CAPS Take by mouth.    Marland Kitchen OVER THE COUNTER MEDICATION [place] women's Ultra Mega [employer] Metabolism    . Triamcinolone Acetonide (NASACORT ALLERGY 24HR NA) Place into the nose.    . vitamin E 400 UNIT capsule Take 400 Units by mouth daily.     No current facility-administered medications on file prior to visit.     No Known Allergies  Family History  Problem Relation Age of Onset  . Macular degeneration Mother     Social History   Social History  . Marital status: Married    Spouse name: N/A  . Number of children: N/A  . Years of education: N/A   Social History Main Topics  . Smoking status: Never Smoker  . Smokeless tobacco: Never Used  . Alcohol use Yes  . Drug use: No  . Sexual activity: Not Asked   Other Topics Concern  . None   Social History  Narrative  . None   Review of Systems - See HPI.  All other ROS are negative.  BP 120/76   Pulse 78   Temp 98.4 F (36.9 C) (Oral)   Resp 14   Ht '5\' 3"'  (1.6 m)   Wt 182 lb (82.6 kg)   SpO2 96%   BMI 32.24 kg/m   Physical Exam  Constitutional: She is oriented to person, place, and time and well-developed, well-nourished, and in no distress.  HENT:  Head: Normocephalic and atraumatic.  Right Ear: External ear normal.  Left Ear: External ear normal.  Nose: Nose normal.  Mouth/Throat: Oropharynx is clear and moist. No oropharyngeal exudate.  TM within normal limits bilaterally.  Eyes: Conjunctivae are normal. Pupils are equal, round, and reactive to light.  Neck: Neck supple.  Cardiovascular: Normal rate, regular rhythm, normal heart sounds and intact distal pulses.   Pulmonary/Chest: Effort normal and breath sounds normal. No respiratory distress. She has no wheezes. She has no rales. She exhibits no tenderness.  Abdominal: Soft. Bowel sounds are normal. There is no hepatosplenomegaly. There is tenderness in the epigastric area and left upper quadrant. There is no rebound, no guarding and no CVA tenderness.  Neurological: She is alert and oriented to person, place, and time.  Skin: Skin is warm and dry. No rash noted.  Psychiatric: Affect normal.  Vitals reviewed.  Recent Results (from the past 2160 hour(s))  Hemoglobin A1c     Status: None   Collection Time: 02/29/16 11:51 AM  Result Value Ref Range   Hgb A1c MFr Bld 6.4 4.6 - 6.5 %    Comment: Glycemic Control Guidelines for People with Diabetes:Non Diabetic:  <6%Goal of Therapy: <7%Additional Action Suggested:  >2%   Basic metabolic panel     Status: Abnormal   Collection Time: 02/29/16 11:51 AM  Result Value Ref Range   Sodium 139 135 - 145 mEq/L   Potassium 4.6 3.5 - 5.1 mEq/L   Chloride 102 96 - 112 mEq/L   CO2 28 19 - 32 mEq/L   Glucose, Bld 121 (H) 70 - 99 mg/dL   BUN 13 6 - 23 mg/dL   Creatinine, Ser 0.83  0.40 - 1.20 mg/dL   Calcium 9.4 8.4 - 10.5 mg/dL   GFR 71.96 >60.00 mL/min  Lipid panel     Status: Abnormal   Collection Time: 02/29/16 11:51 AM  Result Value Ref Range   Cholesterol 163 0 - 200 mg/dL    Comment: ATP III Classification       Desirable:  < 200 mg/dL               Borderline High:  200 - 239 mg/dL          High:  > = 240 mg/dL   Triglycerides 245.0 (H) 0.0 - 149.0 mg/dL    Comment: Normal:  <150 mg/dLBorderline High:  150 - 199 mg/dL   HDL 59.30 >39.00 mg/dL   VLDL 49.0 (H) 0.0 - 40.0 mg/dL   Total CHOL/HDL Ratio 3     Comment:                Men          Women1/2 Average Risk     3.4          3.3Average Risk          5.0          4.42X Average Risk          9.6          7.13X Average Risk          15.0          11.0                       NonHDL 103.78     Comment: NOTE:  Non-HDL goal should be 30 mg/dL higher than patient's LDL goal (i.e. LDL goal of < 70 mg/dL, would have non-HDL goal of < 100 mg/dL)  Hepatic function panel     Status: None   Collection Time: 02/29/16 11:51 AM  Result Value Ref Range   Total Bilirubin 1.1 0.2 - 1.2 mg/dL   Bilirubin, Direct 0.1 0.0 - 0.3 mg/dL   Alkaline Phosphatase 64 39 - 117 U/L   AST 15 0 - 37 U/L   ALT 22 0 - 35 U/L   Total Protein 6.8 6.0 - 8.3 g/dL   Albumin 4.2 3.5 - 5.2 g/dL  TSH     Status: None   Collection Time: 02/29/16 11:51 AM  Result Value Ref Range   TSH 3.15 0.35 - 4.50 uIU/mL  LDL cholesterol, direct     Status: None   Collection Time: 02/29/16 11:51 AM  Result Value Ref Range   Direct LDL 88.0 mg/dL    Comment: Optimal:  <100 mg/dLNear  or Above Optimal:  100-129 mg/dLBorderline High:  130-159 mg/dLHigh:  160-189 mg/dLVery High:  >190 mg/dL  POCT Influenza A     Status: Normal   Collection Time: 05/25/16 11:06 AM  Result Value Ref Range   Rapid Influenza A Ag negative   CBC     Status: Abnormal   Collection Time: 05/25/16 11:50 AM  Result Value Ref Range   WBC 4.0 4.0 - 10.5 K/uL   RBC 4.94 3.87 - 5.11  Mil/uL   Platelets 180.0 150.0 - 400.0 K/uL   Hemoglobin 15.5 (H) 12.0 - 15.0 g/dL   HCT 45.4 36.0 - 46.0 %   MCV 92.0 78.0 - 100.0 fl   MCHC 34.2 30.0 - 36.0 g/dL   RDW 13.5 11.5 - 15.5 %  Comp Met (CMET)     Status: Abnormal   Collection Time: 05/25/16 11:50 AM  Result Value Ref Range   Sodium 133 (L) 135 - 145 mEq/L   Potassium 4.0 3.5 - 5.1 mEq/L   Chloride 98 96 - 112 mEq/L   CO2 25 19 - 32 mEq/L   Glucose, Bld 145 (H) 70 - 99 mg/dL   BUN 11 6 - 23 mg/dL   Creatinine, Ser 0.81 0.40 - 1.20 mg/dL   Total Bilirubin 0.7 0.2 - 1.2 mg/dL   Alkaline Phosphatase 43 39 - 117 U/L   AST 30 0 - 37 U/L   ALT 41 (H) 0 - 35 U/L   Total Protein 6.7 6.0 - 8.3 g/dL   Albumin 4.0 3.5 - 5.2 g/dL   Calcium 8.2 (L) 8.4 - 10.5 mg/dL   GFR 73.97 >60.00 mL/min  Lipase     Status: None   Collection Time: 05/25/16 11:50 AM  Result Value Ref Range   Lipase 28.0 11.0 - 59.0 U/L  POCT Urinalysis Dipstick     Status: Abnormal   Collection Time: 05/25/16 11:54 AM  Result Value Ref Range   Color, UA yellow    Clarity, UA clear    Glucose, UA negative    Bilirubin, UA negative    Ketones, UA 2+    Spec Grav, UA 1.020 1.030 - 1.035   Blood, UA trace    pH, UA 6.0 5.0 - 8.0   Protein, UA negative    Urobilinogen, UA 0.2 Negative - 2.0   Nitrite, UA negative    Leukocytes, UA Trace (A) Negative  Urine Culture     Status: None   Collection Time: 05/25/16  1:16 PM  Result Value Ref Range   Organism ID, Bacteria NO GROWTH     Assessment/Plan: 1. Fever, unspecified fever cause Concern for flu giving initial symptoms typical for flu-like illness. Also concern now for bacterial infection. Flu swab negative. Labs today. CXR obtained revealing concern for developing pneumonitis/pneumonia. ABX started. Supportive measures and OTC medications reviewed. Close FU scheduled.  - POCT Influenza A - DG Chest 2 View; Future - CBC - Comp Met (CMET) - Lipase  2. Epigastric abdominal tenderness without  rebound tenderness Mild. Suspect secondary to increased reflux from drainage. Diet reviewed. Discussed hydration is key. Zofran as directed if needed for nausea. Labs today assess concern for other cause. - CBC - Comp Met (CMET) - Lipase - POCT Urinalysis Dipstick  3. Abnormal urinalysis Trace ketones and LE. Suspect due to mild dehydration. Culture sent. Labs today. Glucose at 145. Discussed importance of hydration. Will reassess at FU.  Alarm signs/symptoms reviewed that would prompt ER assessment. Patient voiced understanding and agreement  with the plan. - Urine Culture   Leeanne Rio, PA-C

## 2016-05-27 LAB — URINE CULTURE: Organism ID, Bacteria: NO GROWTH

## 2016-05-28 ENCOUNTER — Encounter: Payer: Self-pay | Admitting: Family Medicine

## 2016-05-28 ENCOUNTER — Ambulatory Visit (INDEPENDENT_AMBULATORY_CARE_PROVIDER_SITE_OTHER): Payer: Medicare Other | Admitting: Family Medicine

## 2016-05-28 VITALS — BP 120/83 | HR 68 | Temp 98.3°F | Resp 17 | Ht 63.0 in | Wt 179.4 lb

## 2016-05-28 DIAGNOSIS — R110 Nausea: Secondary | ICD-10-CM | POA: Diagnosis not present

## 2016-05-28 DIAGNOSIS — R1013 Epigastric pain: Secondary | ICD-10-CM | POA: Diagnosis not present

## 2016-05-28 LAB — POCT URINALYSIS DIPSTICK
BILIRUBIN UA: NEGATIVE
Glucose, UA: NEGATIVE
KETONES UA: NEGATIVE
Leukocytes, UA: NEGATIVE
Nitrite, UA: NEGATIVE
PH UA: 7 (ref 5.0–8.0)
Protein, UA: NEGATIVE
RBC UA: NEGATIVE
SPEC GRAV UA: 1.01 (ref 1.030–1.035)
Urobilinogen, UA: NEGATIVE (ref ?–2.0)

## 2016-05-28 LAB — HEPATIC FUNCTION PANEL
ALK PHOS: 36 U/L — AB (ref 39–117)
ALT: 36 U/L — ABNORMAL HIGH (ref 0–35)
AST: 23 U/L (ref 0–37)
Albumin: 4.2 g/dL (ref 3.5–5.2)
BILIRUBIN DIRECT: 0.3 mg/dL (ref 0.0–0.3)
BILIRUBIN TOTAL: 1.2 mg/dL (ref 0.2–1.2)
Total Protein: 6.9 g/dL (ref 6.0–8.3)

## 2016-05-28 LAB — CBC WITH DIFFERENTIAL/PLATELET
BASOS PCT: 0.3 % (ref 0.0–3.0)
Basophils Absolute: 0 10*3/uL (ref 0.0–0.1)
EOS ABS: 0 10*3/uL (ref 0.0–0.7)
EOS PCT: 0.8 % (ref 0.0–5.0)
HCT: 44.1 % (ref 36.0–46.0)
Hemoglobin: 15.3 g/dL — ABNORMAL HIGH (ref 12.0–15.0)
LYMPHS ABS: 1.8 10*3/uL (ref 0.7–4.0)
Lymphocytes Relative: 39.7 % (ref 12.0–46.0)
MCHC: 34.6 g/dL (ref 30.0–36.0)
MCV: 90.5 fl (ref 78.0–100.0)
MONO ABS: 0.4 10*3/uL (ref 0.1–1.0)
Monocytes Relative: 9.6 % (ref 3.0–12.0)
NEUTROS PCT: 49.6 % (ref 43.0–77.0)
Neutro Abs: 2.2 10*3/uL (ref 1.4–7.7)
Platelets: 251 10*3/uL (ref 150.0–400.0)
RBC: 4.88 Mil/uL (ref 3.87–5.11)
RDW: 13.1 % (ref 11.5–15.5)
WBC: 4.5 10*3/uL (ref 4.0–10.5)

## 2016-05-28 LAB — H. PYLORI ANTIBODY, IGG: H PYLORI IGG: NEGATIVE

## 2016-05-28 LAB — BASIC METABOLIC PANEL
BUN: 9 mg/dL (ref 6–23)
CALCIUM: 8.6 mg/dL (ref 8.4–10.5)
CO2: 27 mEq/L (ref 19–32)
Chloride: 104 mEq/L (ref 96–112)
Creatinine, Ser: 0.76 mg/dL (ref 0.40–1.20)
GFR: 79.61 mL/min (ref >60.00)
Glucose, Bld: 135 mg/dL — ABNORMAL HIGH (ref 70–99)
Potassium: 4.4 mEq/L (ref 3.5–5.1)
SODIUM: 138 meq/L (ref 135–145)

## 2016-05-28 MED ORDER — RANITIDINE HCL 300 MG PO TABS: 300.0000 mg | ORAL_TABLET | Freq: Every day | ORAL | 1 refills | Status: DC

## 2016-05-28 NOTE — Progress Notes (Signed)
   Subjective:    Patient ID: Sandra Bailey, female    DOB: [birth year], 71 y.o.   MRN: [id number]  HPI Nausea- pt was seen Friday 3/23 w/ similar sxs.  Pt reports she is having severe leg cramps.  Pt is attempting to drink water, recently had small amount of gatorade.  Had beef broth this AM.  Unable to eat due to nausea.  sxs started ~10 days ago.  Tm 100.7.  Fever broke 5 days ago.  Pt reports abdominal pain has improved.  Currently stools are loose and yellow.  Decreased urinary frequency.  No burning w/ urination.  Pt has no hx of diverticulitis.  Taking Zofran w/ only mild relief of sxs.  Pt reports current sxs are similar to previous kidney stones.  Pt reports she is feeling better today than Friday.   Review of Systems For ROS see HPI     Objective:   Physical Exam  Constitutional: She is oriented to person, place, and time. She appears well-developed and well-nourished. No distress.  HENT:  Head: Normocephalic and atraumatic.  MMM  Neck: Neck supple.  Cardiovascular: Normal rate, regular rhythm and intact distal pulses.   Pulmonary/Chest: Effort normal and breath sounds normal. No respiratory distress. She has no wheezes. She has no rales.  Abdominal: Soft. She exhibits no distension. There is tenderness (mild epigastric TTP). There is no rebound.  Hypoactive BS  Lymphadenopathy:    She has no cervical adenopathy.  Neurological: She is alert and oriented to person, place, and time.  Skin: Skin is warm and dry.  Vitals reviewed.         Assessment & Plan:  Nausea w/ epigastric pain- pt reports feeling better than she did when she was seen on Friday.  No longer having fever, confusion.  Able to drink w/o difficulty.  Has not tried to advance diet due to nausea.  Continue Zofran.  Start H2 blocker.  Check H pylori.  Repeat CBC, BMP, LFTs but these were normal on Friday.  UA was WNL today and not suggestive of kidney stone.  Reviewed supportive care and red flags that should  prompt return.  If no improvement in pain, will proceed w/ CT.  Pt expressed understanding and is in agreement w/ plan.

## 2016-05-28 NOTE — Progress Notes (Signed)
Pre visit review using our clinic review tool, if applicable. No additional management support is needed unless otherwise documented below in the visit note. 

## 2016-05-28 NOTE — Patient Instructions (Addendum)
Follow up by phone/MyChart on Wednesday or Thursday and let me know how you're doing We'll notify you of your lab results and make any changes if needed Continue the antibiotics daily Start the Ranitidine nightly to decrease acid and help w/ nausea Drink plenty of fluids Try and advance your diet to applesauce, crackers, toast, etc Call with any questions or concerns Hang in there!!!

## 2016-05-31 ENCOUNTER — Telehealth: Payer: Self-pay | Admitting: Family Medicine

## 2016-05-31 NOTE — Telephone Encounter (Signed)
No follow up is needed.  I'm glad she's feeling better

## 2016-05-31 NOTE — Telephone Encounter (Signed)
Pt was told to call and give a status update on how she was feeling, Pt states that she has finished all of her abx and is able to eat now (not wanting to, but making self) pt states that she has stop taking the zantac, says that see did not see where this was helping her. Pt reports no more cough and not having anymore leg cramps at night. Pt states that she did drink an ensure and seems to think that helped with the leg cramps. Pt asking if a f/u appt is needed.

## 2016-05-31 NOTE — Telephone Encounter (Signed)
Pt notified and stated an understanding.  

## 2016-06-14 DIAGNOSIS — H353132 Nonexudative age-related macular degeneration, bilateral, intermediate dry stage: Secondary | ICD-10-CM | POA: Diagnosis not present

## 2016-06-15 ENCOUNTER — Telehealth: Payer: Self-pay

## 2016-06-15 NOTE — Telephone Encounter (Signed)
Attempted to call patient regarding AWV, both numbers are out of order. Would like to complete AWV with [facility] after appointment with PCP on 06/28/2016.

## 2016-06-28 ENCOUNTER — Ambulatory Visit (INDEPENDENT_AMBULATORY_CARE_PROVIDER_SITE_OTHER): Payer: Medicare Other | Admitting: Family Medicine

## 2016-06-28 ENCOUNTER — Encounter: Payer: Self-pay | Admitting: Family Medicine

## 2016-06-28 VITALS — BP 121/81 | HR 68 | Temp 98.2°F | Resp 17 | Ht 63.0 in | Wt 182.5 lb

## 2016-06-28 DIAGNOSIS — E669 Obesity, unspecified: Secondary | ICD-10-CM | POA: Diagnosis not present

## 2016-06-28 DIAGNOSIS — E66811 Obesity, class 1: Secondary | ICD-10-CM

## 2016-06-28 DIAGNOSIS — Z0000 Encounter for general adult medical examination without abnormal findings: Secondary | ICD-10-CM | POA: Diagnosis not present

## 2016-06-28 DIAGNOSIS — E119 Type 2 diabetes mellitus without complications: Secondary | ICD-10-CM | POA: Diagnosis not present

## 2016-06-28 LAB — BASIC METABOLIC PANEL
BUN: 17 mg/dL (ref 6–23)
CO2: 29 mEq/L (ref 19–32)
Calcium: 9.5 mg/dL (ref 8.4–10.5)
Chloride: 104 mEq/L (ref 96–112)
Creatinine, Ser: 0.8 mg/dL (ref 0.40–1.20)
GFR: 75.01 mL/min (ref >60.00)
GLUCOSE: 125 mg/dL — AB (ref 70–99)
POTASSIUM: 4.1 meq/L (ref 3.5–5.1)
Sodium: 137 mEq/L (ref 135–145)

## 2016-06-28 LAB — HEMOGLOBIN A1C: Hgb A1c MFr Bld: 6.2 % (ref 4.6–6.5)

## 2016-06-28 NOTE — Assessment & Plan Note (Signed)
Ongoing issue for pt.  She has recently gained 4 lbs.  Stressed need for healthy diet and regular exercise.  Will continue to follow closely.

## 2016-06-28 NOTE — Patient Instructions (Addendum)
Follow up in 3-4 months to recheck sugar and cholesterol We'll notify you of your lab results and make any changes if needed Continue to work on healthy diet and regular exercise- you can do it! Call with any questions or concerns [place]!!!  Bring a copy of your advance directives to your next office visit.  Continue doing brain stimulating activities (puzzles, reading, adult coloring books, staying active) to keep memory sharp.   Health Maintenance, Female Adopting a healthy lifestyle and getting preventive care can go a long way to promote health and wellness. Talk with your health care provider about what schedule of regular examinations is right for you. This is a good chance for you to check in with your provider about disease prevention and staying healthy. In between checkups, there are plenty of things you can do on your own. Experts have done a lot of research about which lifestyle changes and preventive measures are most likely to keep you healthy. Ask your health care provider for more information. Weight and diet Eat a healthy diet  Be sure to include plenty of vegetables, fruits, low-fat dairy products, and lean protein.  Do not eat a lot of foods high in solid fats, added sugars, or salt.  Get regular exercise. This is one of the most important things you can do for your health.  Most adults should exercise for at least 150 minutes each week. The exercise should increase your heart rate and make you sweat (moderate-intensity exercise).  Most adults should also do strengthening exercises at least twice a week. This is in addition to the moderate-intensity exercise. Maintain a healthy weight  Body mass index (BMI) is a measurement that can be used to identify possible weight problems. It estimates body fat based on height and weight. Your health care provider can help determine your BMI and help you achieve or maintain a healthy weight.  For females 35 years of age and  older:  A BMI below 18.5 is considered underweight.  A BMI of 18.5 to 24.9 is normal.  A BMI of 25 to 29.9 is considered overweight.  A BMI of 30 and above is considered obese. Watch levels of cholesterol and blood lipids  You should start having your blood tested for lipids and cholesterol at 72 years of age, then have this test every 5 years.  You may need to have your cholesterol levels checked more often if:  Your lipid or cholesterol levels are high.  You are older than 72 years of age.  You are at high risk for heart disease. Cancer screening Lung Cancer  Lung cancer screening is recommended for adults 72-2 years old who are at high risk for lung cancer because of a history of smoking.  A yearly low-dose CT scan of the lungs is recommended for people who:  Currently smoke.  Have quit within the past 15 years.  Have at least a 30-pack-year history of smoking. A pack year is smoking an average of one pack of cigarettes a day for 1 year.  Yearly screening should continue until it has been 15 years since you quit.  Yearly screening should stop if you develop a health problem that would prevent you from having lung cancer treatment. Breast Cancer  Practice breast self-awareness. This means understanding how your breasts normally appear and feel.  It also means doing regular breast self-exams. Let your health care provider know about any changes, no matter how small.  If you are in your 26s  or 55s, you should have a clinical breast exam (CBE) by a health care provider every 1-3 years as part of a regular health exam.  If you are 43 or older, have a CBE every year. Also consider having a breast X-ray (mammogram) every year.  If you have a family history of breast cancer, talk to your health care provider about genetic screening.  If you are at high risk for breast cancer, talk to your health care provider about having an MRI and a mammogram every year.  Breast cancer  gene (BRCA) assessment is recommended for women who have family members with BRCA-related cancers. BRCA-related cancers include:  Breast.  Ovarian.  Tubal.  Peritoneal cancers.  Results of the assessment will determine the need for genetic counseling and BRCA1 and BRCA2 testing. Cervical Cancer  Your health care provider may recommend that you be screened regularly for cancer of the pelvic organs (ovaries, uterus, and vagina). This screening involves a pelvic examination, including checking for microscopic changes to the surface of your cervix (Pap test). You may be encouraged to have this screening done every 3 years, beginning at age 47.  For women ages 66-65, health care providers may recommend pelvic exams and Pap testing every 3 years, or they may recommend the Pap and pelvic exam, combined with testing for human papilloma virus (HPV), every 5 years. Some types of HPV increase your risk of cervical cancer. Testing for HPV may also be done on women of any age with unclear Pap test results.  Other health care providers may not recommend any screening for nonpregnant women who are considered low risk for pelvic cancer and who do not have symptoms. Ask your health care provider if a screening pelvic exam is right for you.  If you have had past treatment for cervical cancer or a condition that could lead to cancer, you need Pap tests and screening for cancer for at least 20 years after your treatment. If Pap tests have been discontinued, your risk factors (such as having a new sexual partner) need to be reassessed to determine if screening should resume. Some women have medical problems that increase the chance of getting cervical cancer. In these cases, your health care provider may recommend more frequent screening and Pap tests. Colorectal Cancer  This type of cancer can be detected and often prevented.  Routine colorectal cancer screening usually begins at 72 years of age and continues  through 71 years of age.  Your health care provider may recommend screening at an earlier age if you have risk factors for colon cancer.  Your health care provider may also recommend using home test kits to check for hidden blood in the stool.  A small camera at the end of a tube can be used to examine your colon directly (sigmoidoscopy or colonoscopy). This is done to check for the earliest forms of colorectal cancer.  Routine screening usually begins at age 68.  Direct examination of the colon should be repeated every 5-10 years through 72 years of age. However, you may need to be screened more often if early forms of precancerous polyps or small growths are found. Skin Cancer  Check your skin from head to toe regularly.  Tell your health care provider about any new moles or changes in moles, especially if there is a change in a mole's shape or color.  Also tell your health care provider if you have a mole that is larger than the size of a pencil eraser.  Always use sunscreen. Apply sunscreen liberally and repeatedly throughout the day.  Protect yourself by wearing long sleeves, pants, a wide-brimmed hat, and sunglasses whenever you are outside. Heart disease, diabetes, and high blood pressure  High blood pressure causes heart disease and increases the risk of stroke. High blood pressure is more likely to develop in:  People who have blood pressure in the high end of the normal range (130-139/85-89 mm Hg).  People who are overweight or obese.  People who are African American.  If you are 60-65 years of age, have your blood pressure checked every 3-5 years. If you are 58 years of age or older, have your blood pressure checked every year. You should have your blood pressure measured twice-once when you are at a hospital or clinic, and once when you are not at a hospital or clinic. Record the average of the two measurements. To check your blood pressure when you are not at a hospital  or clinic, you can use:  An automated blood pressure machine at a pharmacy.  A home blood pressure monitor.  If you are between 64 years and 58 years old, ask your health care provider if you should take aspirin to prevent strokes.  Have regular diabetes screenings. This involves taking a blood sample to check your fasting blood sugar level.  If you are at a normal weight and have a low risk for diabetes, have this test once every three years after 72 years of age.  If you are overweight and have a high risk for diabetes, consider being tested at a younger age or more often. Preventing infection Hepatitis B  If you have a higher risk for hepatitis B, you should be screened for this virus. You are considered at high risk for hepatitis B if:  You were born in a country where hepatitis B is common. Ask your health care provider which countries are considered high risk.  Your parents were born in a high-risk country, and you have not been immunized against hepatitis B (hepatitis B vaccine).  You have HIV or AIDS.  You use needles to inject street drugs.  You live with someone who has hepatitis B.  You have had sex with someone who has hepatitis B.  You get hemodialysis treatment.  You take certain medicines for conditions, including cancer, organ transplantation, and autoimmune conditions. Hepatitis C  Blood testing is recommended for:  Everyone born from [birth year] through 1966.  Anyone with known risk factors for hepatitis C. Sexually transmitted infections (STIs)  You should be screened for sexually transmitted infections (STIs) including gonorrhea and chlamydia if:  You are sexually active and are younger than 72 years of age.  You are older than 72 years of age and your health care provider tells you that you are at risk for this type of infection.  Your sexual activity has changed since you were last screened and you are at an increased risk for chlamydia or gonorrhea. Ask  your health care provider if you are at risk.  If you do not have HIV, but are at risk, it may be recommended that you take a prescription medicine daily to prevent HIV infection. This is called pre-exposure prophylaxis (PrEP). You are considered at risk if:  You are sexually active and do not regularly use condoms or know the HIV status of your partner(s).  You take drugs by injection.  You are sexually active with a partner who has HIV. Talk with your health care provider  about whether you are at high risk of being infected with HIV. If you choose to begin PrEP, you should first be tested for HIV. You should then be tested every 3 months for as long as you are taking PrEP. Pregnancy  If you are premenopausal and you may become pregnant, ask your health care provider about preconception counseling.  If you may become pregnant, take 400 to 800 micrograms (mcg) of folic acid every day.  If you want to prevent pregnancy, talk to your health care provider about birth control (contraception). Osteoporosis and menopause  Osteoporosis is a disease in which the bones lose minerals and strength with aging. This can result in serious bone fractures. Your risk for osteoporosis can be identified using a bone density scan.  If you are 70 years of age or older, or if you are at risk for osteoporosis and fractures, ask your health care provider if you should be screened.  Ask your health care provider whether you should take a calcium or vitamin D supplement to lower your risk for osteoporosis.  Menopause may have certain physical symptoms and risks.  Hormone replacement therapy may reduce some of these symptoms and risks. Talk to your health care provider about whether hormone replacement therapy is right for you. Follow these instructions at home:  Schedule regular health, dental, and eye exams.  Stay current with your immunizations.  Do not use any tobacco products including cigarettes,  chewing tobacco, or electronic cigarettes.  If you are pregnant, do not drink alcohol.  If you are breastfeeding, limit how much and how often you drink alcohol.  Limit alcohol intake to no more than 1 drink per day for nonpregnant women. One drink equals 12 ounces of beer, 5 ounces of wine, or 1 ounces of hard liquor.  Do not use street drugs.  Do not share needles.  Ask your health care provider for help if you need support or information about quitting drugs.  Tell your health care provider if you often feel depressed.  Tell your health care provider if you have ever been abused or do not feel safe at home. This information is not intended to replace advice given to you by your health care provider. Make sure you discuss any questions you have with your health care provider. Document Released: 09/04/2010 Document Revised: 07/28/2015 Document Reviewed: 11/23/2014 Elsevier Interactive Patient Education  2017 [employer].

## 2016-06-28 NOTE — Progress Notes (Signed)
Pre visit review using our clinic review tool, if applicable. No additional management support is needed unless otherwise documented below in the visit note. 

## 2016-06-28 NOTE — Progress Notes (Signed)
   Subjective:    Patient ID: Sandra Bailey, female    DOB: [birth year], 71 y.o.   MRN: [id number]  HPI Diet controlled diabetes- chronic problem, last A1C was 6.4.  UTD on foot exam, eye exam.  UTD on microalbumin.  Pt has gained 4 lbs since last visit.  Denies CP, SOB, HAs, visual changes (just went to eye doctor).  No abd pain, N/V.  Obesity- chronic problem.  Pt has gained 4 lbs.  'i do my best to get exercise' but is not going to [employer].  Pt admits to eating poorly recently.   Review of Systems For ROS see HPI     Objective:   Physical Exam  Constitutional: She is oriented to person, place, and time. She appears well-developed and well-nourished. No distress.  obese  HENT:  Head: Normocephalic and atraumatic.  Eyes: Conjunctivae and EOM are normal. Pupils are equal, round, and reactive to light.  Neck: Normal range of motion. Neck supple. No thyromegaly present.  Cardiovascular: Normal rate, regular rhythm, normal heart sounds and intact distal pulses.   No murmur heard. Pulmonary/Chest: Effort normal and breath sounds normal. No respiratory distress.  Abdominal: Soft. She exhibits no distension. There is no tenderness.  Musculoskeletal: She exhibits no edema.  Lymphadenopathy:    She has no cervical adenopathy.  Neurological: She is alert and oriented to person, place, and time.  Skin: Skin is warm and dry.  Psychiatric: She has a normal mood and affect. Her behavior is normal.  Vitals reviewed.         Assessment & Plan:

## 2016-06-28 NOTE — Progress Notes (Signed)
Subjective:   Sandra Bailey is a 71 y.o. female who presents for an Initial Medicare Annual Wellness Visit.  Review of Systems    No ROS.  Medicare Wellness Visit.   Cardiac Risk Factors include: advanced age (>26mn, >>44women);diabetes mellitus;obesity (BMI >30kg/m2)   Sleep patterns: sleeps 6-8, feels rested. Up to void x 1.  Home Safety/Smoke Alarms: Smoke detectors and security in place.   Living environment; residence and Firearm Safety: Lives with husband in 2 story with basement, rails at steps. Feels safe at home. Firearms locked away.  Seat Belt Safety/Bike Helmet: Wears seat belt.   Counseling:   Eye Exam-Last exam 09/29/2015 yearly by [facility] Every 3 months by [facility]  [facility]2016[facility]   Pap-N/A     Mammo-06/30/2015, Benign. Every 2 years.       Dexa scan-01/12/2016, Osteoporosis. Prolia injections.          CCS-Cologuard 10/11/2015, Negative.       Objective:    Today's Vitals   06/28/16 0905  BP: 121/81  Pulse: 68  Resp: 17  Temp: 98.2 F (36.8 C)  TempSrc: Oral  SpO2: 98%  Weight: 182 lb 8 oz (82.8 kg)  Height: 5' 3"  (1.6 m)   Body mass index is 32.33 kg/m.   Current Medications (verified) Outpatient Encounter Prescriptions as of 06/28/2016  Medication Sig  . Carboxymethylcellulose Sodium (EQ RESTORE PLUS LUBRICANT EYE OP) Apply to eye.  . Cranberry 500 MG CAPS Take by mouth.  . Eyelid Cleansers (SYSTANE LID WIPES EX) Apply topically.  . Multiple Vitamins-Minerals (PRESERVISION AREDS 2) CAPS Take 2 capsules by mouth daily.   . Omega-3 Fatty Acids (FISH OIL) 1000 MG CAPS Take by mouth.  . ondansetron (ZOFRAN) 4 MG tablet Take 1 tablet (4 mg total) by mouth every 8 (eight) hours as needed for nausea or vomiting.  .Marland KitchenOVER THE COUNTER MEDICATION GPitt[facility] Centerwomen's Ultra [employer] & Metabolism  . Triamcinolone Acetonide (NASACORT ALLERGY 24HR NA) Place into the nose.  . vitamin E 400 UNIT capsule Take  400 Units by mouth daily.  . ranitidine (ZANTAC) 300 MG tablet Take 1 tablet (300 mg total) by mouth at bedtime. (Patient not taking: Reported on 06/28/2016)   No facility-administered encounter medications on file as of 06/28/2016.     Allergies (verified) Macrodantin [nitrofurantoin macrocrystal]   History: Past Medical History:  Diagnosis Date  . Diabetes mellitus without complication (HCC)    diet controlled  . Heart murmur   . Kidney stones   . Macular degeneration   . UTI (lower urinary tract infection)    Past Surgical History:  Procedure Laterality Date  . ABDOMINAL HYSTERECTOMY    . LITHOTRIPSY     Family History  Problem Relation Age of Onset  . Macular degeneration Mother    Social History   Occupational History  . Not on file.   Social History Main Topics  . Smoking status: Never Smoker  . Smokeless tobacco: Never Used  . Alcohol use Yes  . Drug use: No  . Sexual activity: Not on file    Tobacco Counseling Counseling given: Yes   Activities of Daily Living In your present state of health, do you have any difficulty performing the following activities: 06/28/2016 08/19/2015  Hearing? N N  Vision? N Y  Difficulty concentrating or making decisions? N N  Walking or climbing stairs? N N  Dressing or bathing? N N  Doing errands, shopping?  N N  Preparing Food and eating ? N -  Using the Toilet? N -  In the past six months, have you accidently leaked urine? N -  Do you have problems with loss of bowel control? N -  Managing your Medications? N -  Managing your Finances? N -  Housekeeping or managing your Housekeeping? N -  Some recent data might be hidden    Immunizations and Health Maintenance Immunization History  Administered Date(s) Administered  . Pneumococcal Conjugate-13 11/30/2015   There are no preventive care reminders to display for this patient.  Patient Care Team: Midge Minium, MD as PCP - General (Family Medicine) Faylene Million, MD as Referring Physician (Internal Medicine) Harle Stanford, MD as Referring Physician (Urology) Victory Dakin, MD as Referring Physician (Optometry) Nicki Reaper Rico Ala, MD as Referring Physician (Ophthalmology) Sandford Craze, MD as Referring Physician (Dermatology)  Indicate any recent Medical Services you may have received from other than [facility] providers in the past year (date may be approximate).     Assessment:   This is a routine wellness examination for Taylia. Physical assessment deferred to PCP.   Hearing/Vision screen Hearing Screening Comments: Able to hear conversational tones w/o difficulty. No issues reported.   Vision Screening Comments: Wears glasses. Mac Deg.  Dietary issues and exercise activities discussed: Current Exercise Habits: Home exercise routine, Time (Minutes): 15, Frequency (Times/Week): 2, Weekly Exercise (Minutes/Week): 30, Exercise limited by: None identified   Diet (meal preparation, eat out, water intake, caffeinated beverages, dairy products, fruits and vegetables): Drinks coffee, green tea, soda and water.   Breakfast: Oatmeal, waffle with pb and honey, eggs, bacon, coffee Lunch: sandwich, salad Dinner: meat and vegetables.      Discussed heart healthy diet and increasing activity.   Goals    . Weight (lb) < 175 lb (79.4 kg)          Lose weight by increasing exercise.       Depression Screen PHQ 2/9 Scores 06/28/2016 08/19/2015  PHQ - 2 Score 0 0    Fall Risk Fall Risk  06/28/2016 08/19/2015  Falls in the past year? No No    Cognitive Function:       Ad8 score reviewed for issues:  Issues making decisions: no  Less interest in hobbies / activities: no  Repeats questions, stories (family complaining): no  Trouble using ordinary gadgets (microwave, computer, phone): no  Forgets the month or year: no  Mismanaging finances: no  Remembering appts: no  Daily problems with thinking and/or memory: no Ad8 score  is=0     Screening Tests Health Maintenance  Topic Date Due  . INFLUENZA VACCINE  12/03/2016 (Originally 10/03/2016)  . TETANUS/TDAP  12/03/2016 (Originally 11/03/1964)  . Hepatitis C Screening  12/03/2016 (Originally 1945/04/19)  . HEMOGLOBIN A1C  08/29/2016  . OPHTHALMOLOGY EXAM  09/28/2016  . FOOT EXAM  11/29/2016  . URINE MICROALBUMIN  11/29/2016  . PNA vac Low Risk Adult (2 of 2 - PPSV23) 11/29/2016  . MAMMOGRAM  06/29/2017  . DEXA SCAN  01/11/2018  . Fecal DNA (Cologuard)  10/11/2018      Plan:    Bring a copy of your advance directives to your next office visit.  Continue doing brain stimulating activities (puzzles, reading, adult coloring books, staying active) to keep memory sharp.    During the course of the visit, Rory was educated and counseled about the following appropriate screening and preventive services:   Vaccines to include Pneumoccal, Influenza,  Hepatitis B, Td, Zostavax, HCV  Cardiovascular disease screening  Colorectal cancer screening  Bone density screening  Diabetes screening  Glaucoma screening  Mammography/PAP  Nutrition counseling   Patient Instructions (the written plan) were given to the patient.    Gerilyn Nestle, RN   06/28/2016   Agree w/ documentation above.  Annye Asa, MD

## 2016-06-28 NOTE — Assessment & Plan Note (Addendum)
Chronic problem.  Currently diet controlled but admits to infrequent exercise and poor diet.  Stressed need for both.  UTD on foot exam, eye exam, microalbumin.  EKG done as baseline- she sees Cards in [place] (Dr Gibson Ramp) Check labs.  Adjust tx plan prn.

## 2016-07-04 DIAGNOSIS — N200 Calculus of kidney: Secondary | ICD-10-CM | POA: Diagnosis not present

## 2016-07-04 DIAGNOSIS — R3914 Feeling of incomplete bladder emptying: Secondary | ICD-10-CM | POA: Diagnosis not present

## 2016-07-04 DIAGNOSIS — B952 Enterococcus as the cause of diseases classified elsewhere: Secondary | ICD-10-CM | POA: Diagnosis not present

## 2016-07-04 DIAGNOSIS — N390 Urinary tract infection, site not specified: Secondary | ICD-10-CM | POA: Diagnosis not present

## 2016-07-04 DIAGNOSIS — Z87440 Personal history of urinary (tract) infections: Secondary | ICD-10-CM | POA: Diagnosis not present

## 2016-07-31 ENCOUNTER — Telehealth: Payer: Self-pay | Admitting: Family Medicine

## 2016-07-31 NOTE — Telephone Encounter (Signed)
Pt calling asking about her 2nd shot for her bone density, pt states that she has some questions regarding this and also wants to make sure that her report was received from her last one.

## 2016-07-31 NOTE — Telephone Encounter (Signed)
Called and spoke with pt husband. I advised that pt is not due for her 2nd injection until 09/24/16. Will call pt when it gets closer to injection date.

## 2016-09-13 DIAGNOSIS — M5417 Radiculopathy, lumbosacral region: Secondary | ICD-10-CM | POA: Diagnosis not present

## 2016-09-13 DIAGNOSIS — M9904 Segmental and somatic dysfunction of sacral region: Secondary | ICD-10-CM | POA: Diagnosis not present

## 2016-09-13 DIAGNOSIS — M9903 Segmental and somatic dysfunction of lumbar region: Secondary | ICD-10-CM | POA: Diagnosis not present

## 2016-09-13 DIAGNOSIS — M5416 Radiculopathy, lumbar region: Secondary | ICD-10-CM | POA: Diagnosis not present

## 2016-09-13 DIAGNOSIS — M545 Low back pain: Secondary | ICD-10-CM | POA: Diagnosis not present

## 2016-09-13 DIAGNOSIS — M5137 Other intervertebral disc degeneration, lumbosacral region: Secondary | ICD-10-CM | POA: Diagnosis not present

## 2016-09-13 DIAGNOSIS — M5136 Other intervertebral disc degeneration, lumbar region: Secondary | ICD-10-CM | POA: Diagnosis not present

## 2016-09-13 DIAGNOSIS — M791 Myalgia: Secondary | ICD-10-CM | POA: Diagnosis not present

## 2016-10-03 ENCOUNTER — Telehealth: Payer: Self-pay | Admitting: Emergency Medicine

## 2016-10-03 DIAGNOSIS — H2513 Age-related nuclear cataract, bilateral: Secondary | ICD-10-CM | POA: Diagnosis not present

## 2016-10-03 DIAGNOSIS — H53039 Strabismic amblyopia, unspecified eye: Secondary | ICD-10-CM | POA: Diagnosis not present

## 2016-10-03 DIAGNOSIS — H353131 Nonexudative age-related macular degeneration, bilateral, early dry stage: Secondary | ICD-10-CM | POA: Diagnosis not present

## 2016-10-03 DIAGNOSIS — H40033 Anatomical narrow angle, bilateral: Secondary | ICD-10-CM | POA: Diagnosis not present

## 2016-10-03 LAB — HM DIABETES EYE EXAM

## 2016-10-03 NOTE — Telephone Encounter (Signed)
LMOVM advising patient if she wanted to schedule a nurse visit for the Prolia injection or wait until her appointment on 10/17/16.   Per Jess patient is due for her Prolia injection. Her Prolia injection is here in the office that she can receive.

## 2016-10-04 NOTE — Telephone Encounter (Signed)
Pt called back, states she will wait until appt due to having questions about injection.

## 2016-10-04 NOTE — Telephone Encounter (Signed)
Noted will notify Janett Billow that patient wants at her upcoming appointment.

## 2016-10-17 ENCOUNTER — Encounter: Payer: Self-pay | Admitting: General Practice

## 2016-10-17 ENCOUNTER — Encounter: Payer: Self-pay | Admitting: Family Medicine

## 2016-10-17 ENCOUNTER — Ambulatory Visit (INDEPENDENT_AMBULATORY_CARE_PROVIDER_SITE_OTHER): Payer: Medicare Other | Admitting: Family Medicine

## 2016-10-17 VITALS — BP 128/84 | HR 58 | Temp 98.1°F | Resp 17 | Ht 63.0 in | Wt 183.0 lb

## 2016-10-17 DIAGNOSIS — M810 Age-related osteoporosis without current pathological fracture: Secondary | ICD-10-CM

## 2016-10-17 DIAGNOSIS — E119 Type 2 diabetes mellitus without complications: Secondary | ICD-10-CM

## 2016-10-17 DIAGNOSIS — E781 Pure hyperglyceridemia: Secondary | ICD-10-CM

## 2016-10-17 DIAGNOSIS — R350 Frequency of micturition: Secondary | ICD-10-CM

## 2016-10-17 DIAGNOSIS — E669 Obesity, unspecified: Secondary | ICD-10-CM

## 2016-10-17 LAB — CBC WITH DIFFERENTIAL/PLATELET
Basophils Absolute: 0.1 10*3/uL (ref 0.0–0.1)
Basophils Relative: 0.9 % (ref 0.0–3.0)
EOS PCT: 4.4 % (ref 0.0–5.0)
Eosinophils Absolute: 0.3 10*3/uL (ref 0.0–0.7)
HEMATOCRIT: 44.6 % (ref 36.0–46.0)
HEMOGLOBIN: 15.2 g/dL — AB (ref 12.0–15.0)
LYMPHS PCT: 32.5 % (ref 12.0–46.0)
Lymphs Abs: 1.9 10*3/uL (ref 0.7–4.0)
MCHC: 34.1 g/dL (ref 30.0–36.0)
MCV: 95.4 fl (ref 78.0–100.0)
MONO ABS: 0.5 10*3/uL (ref 0.1–1.0)
Monocytes Relative: 9 % (ref 3.0–12.0)
Neutro Abs: 3.2 10*3/uL (ref 1.4–7.7)
Neutrophils Relative %: 53.2 % (ref 43.0–77.0)
Platelets: 266 10*3/uL (ref 150.0–400.0)
RBC: 4.68 Mil/uL (ref 3.87–5.11)
RDW: 13.3 % (ref 11.5–15.5)
WBC: 5.9 10*3/uL (ref 4.0–10.5)

## 2016-10-17 LAB — POCT URINALYSIS DIPSTICK
Bilirubin, UA: NEGATIVE
Blood, UA: NEGATIVE
Glucose, UA: NEGATIVE
KETONES UA: NEGATIVE
LEUKOCYTES UA: NEGATIVE
Nitrite, UA: NEGATIVE
PH UA: 6 (ref 5.0–8.0)
PROTEIN UA: NEGATIVE
SPEC GRAV UA: 1.015 (ref 1.010–1.025)
Urobilinogen, UA: 0.2 E.U./dL

## 2016-10-17 LAB — HEPATIC FUNCTION PANEL
ALBUMIN: 4.4 g/dL (ref 3.5–5.2)
ALK PHOS: 35 U/L — AB (ref 39–117)
ALT: 24 U/L (ref 0–35)
AST: 15 U/L (ref 0–37)
Bilirubin, Direct: 0.2 mg/dL (ref 0.0–0.3)
TOTAL PROTEIN: 6.5 g/dL (ref 6.0–8.3)
Total Bilirubin: 1.3 mg/dL — ABNORMAL HIGH (ref 0.2–1.2)

## 2016-10-17 LAB — LIPID PANEL
Cholesterol: 151 mg/dL (ref 0–200)
HDL: 60.1 mg/dL (ref >39.00)
LDL Cholesterol: 59 mg/dL (ref 0–99)
NONHDL: 90.85
Total CHOL/HDL Ratio: 3
Triglycerides: 157 mg/dL — ABNORMAL HIGH (ref 0.0–149.0)
VLDL: 31.4 mg/dL (ref 0.0–40.0)

## 2016-10-17 LAB — BASIC METABOLIC PANEL
BUN: 15 mg/dL (ref 6–23)
CHLORIDE: 104 meq/L (ref 96–112)
CO2: 29 meq/L (ref 19–32)
Calcium: 9.3 mg/dL (ref 8.4–10.5)
Creatinine, Ser: 0.8 mg/dL (ref 0.40–1.20)
GFR: 74.95 mL/min (ref >60.00)
Glucose, Bld: 146 mg/dL — ABNORMAL HIGH (ref 70–99)
Potassium: 4.1 mEq/L (ref 3.5–5.1)
SODIUM: 139 meq/L (ref 135–145)

## 2016-10-17 LAB — HEMOGLOBIN A1C: HEMOGLOBIN A1C: 6.3 % (ref 4.6–6.5)

## 2016-10-17 LAB — MICROALBUMIN / CREATININE URINE RATIO
Creatinine,U: 60.4 mg/dL
MICROALB/CREAT RATIO: 1.2 mg/g (ref 0.0–30.0)

## 2016-10-17 LAB — TSH: TSH: 3.52 u[IU]/mL (ref 0.35–4.50)

## 2016-10-17 MED ORDER — DENOSUMAB 60 MG/ML ~~LOC~~ SOLN
60.0000 mg | Freq: Once | SUBCUTANEOUS | Status: AC
  Administered 2016-10-17: 60 mg via SUBCUTANEOUS

## 2016-10-17 NOTE — Patient Instructions (Signed)
Schedule your Medicare Wellness Visit w/ Kim at your convenience Follow up with me in 6 months to recheck diabetes and cholesterol Continue to work on healthy diet and regular exercise- you can do it!! Call with any questions or concerns [place]!!!

## 2016-10-17 NOTE — Assessment & Plan Note (Signed)
Chronic problem.  Due for Prolia today.  Shot given.

## 2016-10-17 NOTE — Progress Notes (Signed)
   Subjective:    Patient ID: Sandra Bailey, female    DOB: 09/21/1945, 71 y.o.   MRN: [id number]  HPI DM- chronic problem, diet controlled.  Most recent A1C 6.2  UTD on eye exam.  Due for foot exam and microalbumin next month so will get those today.  No CP, SOB, HAs, visual changes, edema.  No numbness/tingling of hands/feet.  Hyperlipidemia- chronic problem, on Fish oil daily w/ hx of good LDL but poor triglycerides.  Denies abd pain, N/V, myalgias.   Osteoporosis- chronic problem.  Getting Prolia injxns q6 months.  Pt has concerns about osteonecrosis and kidney stones.   Review of Systems For ROS see HPI     Objective:   Physical Exam  Constitutional: She is oriented to person, place, and time. She appears well-developed and well-nourished. No distress.  HENT:  Head: Normocephalic and atraumatic.  Eyes: Pupils are equal, round, and reactive to light. Conjunctivae and EOM are normal.  Neck: Normal range of motion. Neck supple. No thyromegaly present.  Cardiovascular: Normal rate, regular rhythm, normal heart sounds and intact distal pulses.   No murmur heard. Pulmonary/Chest: Effort normal and breath sounds normal. No respiratory distress.  Abdominal: Soft. She exhibits no distension. There is no tenderness.  Musculoskeletal: She exhibits no edema.  Lymphadenopathy:    She has no cervical adenopathy.  Neurological: She is alert and oriented to person, place, and time.  Skin: Skin is warm and dry.  Psychiatric: She has a normal mood and affect. Her behavior is normal.  Vitals reviewed.         Assessment & Plan:

## 2016-10-17 NOTE — Assessment & Plan Note (Signed)
Chronic problem.  Stressed need for healthy diet and regular exercise.  Will continue to follow. 

## 2016-10-17 NOTE — Assessment & Plan Note (Signed)
Chronic problem.  Currently diet controlled.  UTD on eye exam.  Due for foot exam and microalbumin- done today.  Stressed need for healthy diet and regular exercise.  Check labs.  Adjust tx plan prn.

## 2016-10-17 NOTE — Progress Notes (Signed)
Pre visit review using our clinic review tool, if applicable. No additional management support is needed unless otherwise documented below in the visit note. 

## 2016-10-17 NOTE — Assessment & Plan Note (Signed)
Chronic problem.  On fish oil and attempting to control w/ diet.  Check labs.  Adjust meds prn

## 2016-12-20 DIAGNOSIS — H353132 Nonexudative age-related macular degeneration, bilateral, intermediate dry stage: Secondary | ICD-10-CM | POA: Diagnosis not present

## 2017-01-29 DIAGNOSIS — M71341 Other bursal cyst, right hand: Secondary | ICD-10-CM | POA: Diagnosis not present

## 2017-01-29 DIAGNOSIS — L821 Other seborrheic keratosis: Secondary | ICD-10-CM | POA: Diagnosis not present

## 2017-01-29 DIAGNOSIS — L570 Actinic keratosis: Secondary | ICD-10-CM | POA: Diagnosis not present

## 2017-01-29 DIAGNOSIS — D485 Neoplasm of uncertain behavior of skin: Secondary | ICD-10-CM | POA: Diagnosis not present

## 2017-03-07 DIAGNOSIS — H00012 Hordeolum externum right lower eyelid: Secondary | ICD-10-CM | POA: Diagnosis not present

## 2017-03-19 DIAGNOSIS — I483 Typical atrial flutter: Secondary | ICD-10-CM | POA: Diagnosis not present

## 2017-03-19 DIAGNOSIS — I4510 Unspecified right bundle-branch block: Secondary | ICD-10-CM | POA: Diagnosis not present

## 2017-04-04 DIAGNOSIS — H353132 Nonexudative age-related macular degeneration, bilateral, intermediate dry stage: Secondary | ICD-10-CM | POA: Diagnosis not present

## 2017-05-01 ENCOUNTER — Ambulatory Visit (INDEPENDENT_AMBULATORY_CARE_PROVIDER_SITE_OTHER): Payer: Medicare Other | Admitting: Family Medicine

## 2017-05-01 ENCOUNTER — Other Ambulatory Visit: Payer: Self-pay

## 2017-05-01 ENCOUNTER — Encounter: Payer: Self-pay | Admitting: Family Medicine

## 2017-05-01 VITALS — BP 124/62 | HR 68 | Temp 98.1°F | Resp 17 | Ht 63.0 in | Wt 187.0 lb

## 2017-05-01 DIAGNOSIS — Z87442 Personal history of urinary calculi: Secondary | ICD-10-CM

## 2017-05-01 DIAGNOSIS — E119 Type 2 diabetes mellitus without complications: Secondary | ICD-10-CM

## 2017-05-01 DIAGNOSIS — E781 Pure hyperglyceridemia: Secondary | ICD-10-CM | POA: Diagnosis not present

## 2017-05-01 DIAGNOSIS — M810 Age-related osteoporosis without current pathological fracture: Secondary | ICD-10-CM

## 2017-05-01 LAB — CBC WITH DIFFERENTIAL/PLATELET
BASOS ABS: 0.1 10*3/uL (ref 0.0–0.1)
Basophils Relative: 0.7 % (ref 0.0–3.0)
EOS ABS: 0.3 10*3/uL (ref 0.0–0.7)
Eosinophils Relative: 4.4 % (ref 0.0–5.0)
HCT: 43.1 % (ref 36.0–46.0)
HEMOGLOBIN: 15 g/dL (ref 12.0–15.0)
LYMPHS ABS: 1.8 10*3/uL (ref 0.7–4.0)
Lymphocytes Relative: 25 % (ref 12.0–46.0)
MCHC: 34.7 g/dL (ref 30.0–36.0)
MCV: 93.1 fl (ref 78.0–100.0)
MONO ABS: 0.5 10*3/uL (ref 0.1–1.0)
Monocytes Relative: 7.3 % (ref 3.0–12.0)
NEUTROS PCT: 62.6 % (ref 43.0–77.0)
Neutro Abs: 4.5 10*3/uL (ref 1.4–7.7)
Platelets: 246 10*3/uL (ref 150.0–400.0)
RBC: 4.63 Mil/uL (ref 3.87–5.11)
RDW: 13 % (ref 11.5–15.5)
WBC: 7.2 10*3/uL (ref 4.0–10.5)

## 2017-05-01 LAB — HEPATIC FUNCTION PANEL
ALBUMIN: 3.8 g/dL (ref 3.5–5.2)
ALT: 24 U/L (ref 0–35)
AST: 15 U/L (ref 0–37)
Alkaline Phosphatase: 34 U/L — ABNORMAL LOW (ref 39–117)
Bilirubin, Direct: 0.2 mg/dL (ref 0.0–0.3)
TOTAL PROTEIN: 6.5 g/dL (ref 6.0–8.3)
Total Bilirubin: 1.1 mg/dL (ref 0.2–1.2)

## 2017-05-01 LAB — BASIC METABOLIC PANEL
BUN: 14 mg/dL (ref 6–23)
CALCIUM: 9.7 mg/dL (ref 8.4–10.5)
CO2: 30 meq/L (ref 19–32)
Chloride: 102 mEq/L (ref 96–112)
Creatinine, Ser: 0.86 mg/dL (ref 0.40–1.20)
GFR: 68.84 mL/min (ref >60.00)
GLUCOSE: 125 mg/dL — AB (ref 70–99)
Potassium: 4.2 mEq/L (ref 3.5–5.1)
Sodium: 137 mEq/L (ref 135–145)

## 2017-05-01 LAB — TSH: TSH: 3.04 u[IU]/mL (ref 0.35–4.50)

## 2017-05-01 LAB — LIPID PANEL
Cholesterol: 154 mg/dL (ref 0–200)
HDL: 53.5 mg/dL (ref >39.00)
NONHDL: 100.48
Total CHOL/HDL Ratio: 3
Triglycerides: 210 mg/dL — ABNORMAL HIGH (ref 0.0–149.0)
VLDL: 42 mg/dL — ABNORMAL HIGH (ref 0.0–40.0)

## 2017-05-01 LAB — LDL CHOLESTEROL, DIRECT: Direct LDL: 84 mg/dL

## 2017-05-01 LAB — HEMOGLOBIN A1C: Hgb A1c MFr Bld: 6.5 % (ref 4.6–6.5)

## 2017-05-01 MED ORDER — DENOSUMAB 60 MG/ML ~~LOC~~ SOLN
60.0000 mg | Freq: Once | SUBCUTANEOUS | Status: AC
  Administered 2017-05-01: 60 mg via SUBCUTANEOUS

## 2017-05-01 NOTE — Addendum Note (Signed)
Addended by: Davis Gourd on: 05/01/2017 10:04 AM   Modules accepted: Orders

## 2017-05-01 NOTE — Progress Notes (Signed)
   Subjective:    Patient ID: Sandra Bailey, female    DOB: 1945/05/18, 72 y.o.   MRN: [id number]  HPI DM- chronic problem.  Currently diet controlled.  Last A1C 6.3  UTD on eye exam, foot exam, microalbumin.  Pt has gained 4 lbs since last visit.  Denies CP, SOB, HAs, visual changes, edema.  No numbness/tingling of hands or feet.  Hypertriglyceridemia- chronic problem.  On Fish Oil daily.  Denies abd pain, N/V.  Osteoporosis- age related.  Due for repeat Prolia injxn today   Review of Systems For ROS see HPI     Objective:   Physical Exam  Constitutional: She is oriented to person, place, and time. She appears well-developed and well-nourished. No distress.  HENT:  Head: Normocephalic and atraumatic.  Eyes: Conjunctivae and EOM are normal. Pupils are equal, round, and reactive to light.  Neck: Normal range of motion. Neck supple. No thyromegaly present.  Cardiovascular: Normal rate, regular rhythm, normal heart sounds and intact distal pulses.  No murmur heard. Pulmonary/Chest: Effort normal and breath sounds normal. No respiratory distress.  Abdominal: Soft. She exhibits no distension. There is no tenderness.  Musculoskeletal: She exhibits no edema.  Lymphadenopathy:    She has no cervical adenopathy.  Neurological: She is alert and oriented to person, place, and time.  Skin: Skin is warm and dry.  Psychiatric: She has a normal mood and affect. Her behavior is normal.  Vitals reviewed.         Assessment & Plan:

## 2017-05-01 NOTE — Assessment & Plan Note (Signed)
Chronic problem.  Hx of good control w/ diet.  UTD on eye exam, foot exam, and microalbumin.  Encouraged healthy diet and regular exercise as pt keeps gaining weight.  Check labs.  Adjust tx plan prn.

## 2017-05-01 NOTE — Assessment & Plan Note (Signed)
Chronic problem.  On fish oil w/ hx of good control.  Check labs.  Adjust meds prn

## 2017-05-01 NOTE — Patient Instructions (Addendum)
Follow up in 6 months to recheck sugars We'll notify you of your lab results and make any changes if needed Continue to work on healthy diet and regular exercise- you can do it!!! We'll call you with your urology appt Call with any questions or concerns [place]

## 2017-05-01 NOTE — Assessment & Plan Note (Signed)
Refer to new urologist at pt's request

## 2017-05-01 NOTE — Assessment & Plan Note (Signed)
Prolia injxn given today.

## 2017-05-02 ENCOUNTER — Encounter: Payer: Self-pay | Admitting: General Practice

## 2017-07-02 NOTE — Progress Notes (Addendum)
Subjective:   Sandra Bailey is a 72 y.o. female who presents for Medicare Annual (Subsequent) preventive examination.  Review of Systems:  No ROS.  Medicare Wellness Visit. Additional risk factors are reflected in the social history.  Cardiac Risk Factors include: advanced age (>68men, >74 women);diabetes mellitus;smoking/ tobacco exposure;obesity (BMI >30kg/m2)   Sleep patterns: Sleeps 8 hours.  Home Safety/Smoke Alarms: Feels safe in home. Smoke alarms in place.  Living environment; residence and Firearm Safety: Lives with husband in 2 story with basement, rails at steps.  Seat Belt Safety/Bike Helmet: Wears seat belt.   Female:   Pap-N/A     Mammo-06/30/2015, Benign. Every 2 years. Will schedule in [place].  Dexa scan-01/12/2016, Osteoporosis. Prolia injections.          CCS-Cologuard 10/11/2015, Negative.      Objective:     Vitals: BP 126/70 (BP Location: Left Arm, Patient Position: Sitting, Cuff Size: Normal)   Pulse 78   Ht 5\' 3"  (1.6 m)   Wt 188 lb 4 oz (85.4 kg)   SpO2 98%   BMI 33.35 kg/m   Body mass index is 33.35 kg/m.  Advanced Directives 07/03/2017 06/28/2016  Does Patient Have a Medical Advance Directive? No No  Would patient like information on creating a medical advance directive? Yes (MAU/Ambulatory/Procedural Areas - Information given) Yes (MAU/Ambulatory/Procedural Areas - Information given)    Tobacco Social History   Tobacco Use  Smoking Status Never Smoker  Smokeless Tobacco Never Used     Counseling given: Not Answered    Past Medical History:  Diagnosis Date  . Diabetes mellitus without complication (HCC)    diet controlled  . Heart murmur   . Kidney stones   . Macular degeneration   . UTI (lower urinary tract infection)    Past Surgical History:  Procedure Laterality Date  . ABDOMINAL HYSTERECTOMY    . LITHOTRIPSY     Family History  Problem Relation Age of Onset  . Macular degeneration Mother   . Hypertension Father   .  Glaucoma Sister   . Hypertension Sister   . Heart disease Brother   . Panic disorder Son    Social History   Socioeconomic History  . Marital status: Married    Spouse name: Not on file  . Number of children: Not on file  . Years of education: Not on file  . Highest education level: Not on file  Occupational History  . Not on file  Social Needs  . Financial resource strain: Not on file  . Food insecurity:    Worry: Not on file    Inability: Not on file  . Transportation needs:    Medical: Not on file    Non-medical: Not on file  Tobacco Use  . Smoking status: Never Smoker  . Smokeless tobacco: Never Used  Substance and Sexual Activity  . Alcohol use: Yes  . Drug use: No  . Sexual activity: Not on file  Lifestyle  . Physical activity:    Days per week: Not on file    Minutes per session: Not on file  . Stress: Not on file  Relationships  . Social connections:    Talks on phone: Not on file    Gets together: Not on file    Attends religious service: Not on file    Active member of club or organization: Not on file    Attends meetings of clubs or organizations: Not on file    Relationship status: Not on  file  Other Topics Concern  . Not on file  Social History Narrative  . Not on file    Outpatient Encounter Medications as of 07/03/2017  Medication Sig  . Cranberry 500 MG CAPS Take by mouth.  . Multiple Vitamins-Minerals (CENTRUM SILVER 50+WOMEN) TABS Take by mouth.  . Multiple Vitamins-Minerals (PRESERVISION AREDS 2) CAPS Take 2 capsules by mouth daily.   . Omega-3 Fatty Acids (FISH OIL) 1000 MG CAPS Take by mouth.  . Ophthalmic Irrigation Solution (EYE [place] OP) Apply to eye.  . Triamcinolone Acetonide (NASACORT ALLERGY 24HR NA) Place into the nose.  . vitamin E 400 UNIT capsule Take 400 Units by mouth daily.   No facility-administered encounter medications on file as of 07/03/2017.     Activities of Daily Living In your present state of health, do you have  any difficulty performing the following activities: 07/03/2017 05/01/2017  Hearing? N N  Vision? N N  Difficulty concentrating or making decisions? N N  Walking or climbing stairs? N N  Dressing or bathing? N N  Doing errands, shopping? N N  Preparing Food and eating ? N -  Using the Toilet? N -  In the past six months, have you accidently leaked urine? N -  Do you have problems with loss of bowel control? N -  Managing your Medications? N -  Managing your Finances? N -  Housekeeping or managing your Housekeeping? N -  Some recent data might be hidden    Patient Care Team: Midge Minium, MD as PCP - General (Family Medicine) Faylene Million, MD as Referring Physician (Internal Medicine) Harle Stanford, MD as Referring Physician (Urology) Victory Dakin, MD as Referring Physician (Optometry) Garwin Brothers, Luretha Rued, MD as Referring Physician (Ophthalmology) Sandford Craze, MD as Referring Physician (Dermatology)    Assessment:   This is a routine wellness examination for Sandra Bailey.  Exercise Activities and Dietary recommendations Current Exercise Habits: The patient does not participate in regular exercise at present(Maintains house chores; stays active most days), Exercise limited by: None identified   Diet (meal preparation, eat out, water intake, caffeinated beverages, dairy products, fruits and vegetables): Drinks water, green tea and coffee.   Breakfast: protein shake; bacon, eggs, toast; cereal; coffee Lunch: fast food; sandwich Dinner: protein and vegetables.     Goals    . Weight (lb) < 175 lb (79.4 kg)     Lose weight by increasing exercise.     . Weight (lb) < 180 lb (81.6 kg)     Lose weight by increasing activity.        Fall Risk Fall Risk  07/03/2017 05/01/2017 10/17/2016 06/28/2016 08/19/2015  Falls in the past year? No No No No No    Depression Screen PHQ 2/9 Scores 07/03/2017 05/01/2017 10/17/2016 06/28/2016  PHQ - 2 Score 0 0 0 0  PHQ- 9 Score - 0 0 -       Cognitive Function MMSE - Mini Mental State Exam 07/03/2017  Orientation to time 5  Orientation to Place 5  Registration 3  Attention/ Calculation 3  Recall 3  Language- name 2 objects 2  Language- repeat 1  Language- follow 3 step command 3  Language- read & follow direction 1  Write a sentence 1  Copy design 1  Total score 28        Immunization History  Administered Date(s) Administered  . Pneumococcal Conjugate-13 11/30/2015     Screening Tests Health Maintenance  Topic Date Due  . MAMMOGRAM  08/26/2017 (Originally 06/29/2017)  . INFLUENZA VACCINE  01/01/2018 (Originally 10/03/2017)  . TETANUS/TDAP  05/01/2018 (Originally 11[birth year]03/1964)  . Hepatitis C Screening  05/01/2018 (Originally December 13, 1945)  . PNA vac Low Risk Adult (2 of 2 - PPSV23) 05/01/2018 (Originally 11/29/2016)  . OPHTHALMOLOGY EXAM  10/03/2017  . FOOT EXAM  10/17/2017  . URINE MICROALBUMIN  10/17/2017  . HEMOGLOBIN A1C  10/29/2017  . DEXA SCAN  01/11/2018  . Fecal DNA (Cologuard)  10/11/2018       Plan:     Call Imaging to schedule your mammogram. [phone].   Bring a copy of your living will and/or healthcare power of attorney to your next office visit.  Continue doing brain stimulating activities (puzzles, reading, adult coloring books, staying active) to keep memory sharp.   I have personally reviewed and noted the following in the patient's chart:   . Medical and social history . Use of alcohol, tobacco or illicit drugs  . Current medications and supplements . Functional ability and status . Nutritional status . Physical activity . Advanced directives . List of other physicians . Hospitalizations, surgeries, and ER visits in previous 12 months . Vitals . Screenings to include cognitive, depression, and falls . Referrals and appointments  In addition, I have reviewed and discussed with patient certain preventive protocols, quality metrics, and best practice recommendations. A written  personalized care plan for preventive services as well as general preventive health recommendations were provided to patient.     Gerilyn Nestle, RN  07/03/2017  Reviewed documentation provided by RN and agree w/ above.  Annye Asa, MD

## 2017-07-03 ENCOUNTER — Ambulatory Visit (INDEPENDENT_AMBULATORY_CARE_PROVIDER_SITE_OTHER): Payer: Medicare Other

## 2017-07-03 ENCOUNTER — Ambulatory Visit: Payer: Medicare Other

## 2017-07-03 ENCOUNTER — Other Ambulatory Visit: Payer: Self-pay

## 2017-07-03 DIAGNOSIS — Z0000 Encounter for general adult medical examination without abnormal findings: Secondary | ICD-10-CM

## 2017-07-03 DIAGNOSIS — Z23 Encounter for immunization: Secondary | ICD-10-CM

## 2017-07-03 NOTE — Patient Instructions (Addendum)
Call Imaging to schedule your mammogram. [phone].   Bring a copy of your living will and/or healthcare power of attorney to your next office visit.  Continue doing brain stimulating activities (puzzles, reading, adult coloring books, staying active) to keep memory sharp.    Health Maintenance, Female Adopting a healthy lifestyle and getting preventive care can go a long way to promote health and wellness. Talk with your health care provider about what schedule of regular examinations is right for you. This is a good chance for you to check in with your provider about disease prevention and staying healthy. In between checkups, there are plenty of things you can do on your own. Experts have done a lot of research about which lifestyle changes and preventive measures are most likely to keep you healthy. Ask your health care provider for more information. Weight and diet Eat a healthy diet  Be sure to include plenty of vegetables, fruits, low-fat dairy products, and lean protein.  Do not eat a lot of foods high in solid fats, added sugars, or salt.  Get regular exercise. This is one of the most important things you can do for your health. ? Most adults should exercise for at least 150 minutes each week. The exercise should increase your heart rate and make you sweat (moderate-intensity exercise). ? Most adults should also do strengthening exercises at least twice a week. This is in addition to the moderate-intensity exercise.  Maintain a healthy weight  Body mass index (BMI) is a measurement that can be used to identify possible weight problems. It estimates body fat based on height and weight. Your health care provider can help determine your BMI and help you achieve or maintain a healthy weight.  For females 22 years of age and older: ? A BMI below 18.5 is considered underweight. ? A BMI of 18.5 to 24.9 is normal. ? A BMI of 25 to 29.9 is considered overweight. ? A BMI of 30 and  above is considered obese.  Watch levels of cholesterol and blood lipids  You should start having your blood tested for lipids and cholesterol at 73 years of age, then have this test every 5 years.  You may need to have your cholesterol levels checked more often if: ? Your lipid or cholesterol levels are high. ? You are older than 73 years of age. ? You are at high risk for heart disease.  Cancer screening Lung Cancer  Lung cancer screening is recommended for adults 45-67 years old who are at high risk for lung cancer because of a history of smoking.  A yearly low-dose CT scan of the lungs is recommended for people who: ? Currently smoke. ? Have quit within the past 15 years. ? Have at least a 30-pack-year history of smoking. A pack year is smoking an average of one pack of cigarettes a day for 1 year.  Yearly screening should continue until it has been 15 years since you quit.  Yearly screening should stop if you develop a health problem that would prevent you from having lung cancer treatment.  Breast Cancer  Practice breast self-awareness. This means understanding how your breasts normally appear and feel.  It also means doing regular breast self-exams. Let your health care provider know about any changes, no matter how small.  If you are in your 20s or 30s, you should have a clinical breast exam (CBE) by a health care provider every 1-3 years as part of a regular health exam.  If you are 40 or older, have a CBE every year. Also consider having a breast X-ray (mammogram) every year.  If you have a family history of breast cancer, talk to your health care provider about genetic screening.  If you are at high risk for breast cancer, talk to your health care provider about having an MRI and a mammogram every year.  Breast cancer gene (BRCA) assessment is recommended for women who have family members with BRCA-related cancers. BRCA-related cancers  include: ? Breast. ? Ovarian. ? Tubal. ? Peritoneal cancers.  Results of the assessment will determine the need for genetic counseling and BRCA1 and BRCA2 testing.  Cervical Cancer Your health care provider may recommend that you be screened regularly for cancer of the pelvic organs (ovaries, uterus, and vagina). This screening involves a pelvic examination, including checking for microscopic changes to the surface of your cervix (Pap test). You may be encouraged to have this screening done every 3 years, beginning at age 21.  For women ages 30-65, health care providers may recommend pelvic exams and Pap testing every 3 years, or they may recommend the Pap and pelvic exam, combined with testing for human papilloma virus (HPV), every 5 years. Some types of HPV increase your risk of cervical cancer. Testing for HPV may also be done on women of any age with unclear Pap test results.  Other health care providers may not recommend any screening for nonpregnant women who are considered low risk for pelvic cancer and who do not have symptoms. Ask your health care provider if a screening pelvic exam is right for you.  If you have had past treatment for cervical cancer or a condition that could lead to cancer, you need Pap tests and screening for cancer for at least 20 years after your treatment. If Pap tests have been discontinued, your risk factors (such as having a new sexual partner) need to be reassessed to determine if screening should resume. Some women have medical problems that increase the chance of getting cervical cancer. In these cases, your health care provider may recommend more frequent screening and Pap tests.  Colorectal Cancer  This type of cancer can be detected and often prevented.  Routine colorectal cancer screening usually begins at 73 years of age and continues through 73 years of age.  Your health care provider may recommend screening at an earlier age if you have risk factors  for colon cancer.  Your health care provider may also recommend using home test kits to check for hidden blood in the stool.  A small camera at the end of a tube can be used to examine your colon directly (sigmoidoscopy or colonoscopy). This is done to check for the earliest forms of colorectal cancer.  Routine screening usually begins at age 50.  Direct examination of the colon should be repeated every 5-10 years through 73 years of age. However, you may need to be screened more often if early forms of precancerous polyps or small growths are found.  Skin Cancer  Check your skin from head to toe regularly.  Tell your health care provider about any new moles or changes in moles, especially if there is a change in a mole's shape or color.  Also tell your health care provider if you have a mole that is larger than the size of a pencil eraser.  Always use sunscreen. Apply sunscreen liberally and repeatedly throughout the day.  Protect yourself by wearing long sleeves, pants, a wide-brimmed hat, and   sunglasses whenever you are outside.  Heart disease, diabetes, and high blood pressure  High blood pressure causes heart disease and increases the risk of stroke. High blood pressure is more likely to develop in: ? People who have blood pressure in the high end of the normal range (130-139/85-89 mm Hg). ? People who are overweight or obese. ? People who are African American.  If you are 25-70 years of age, have your blood pressure checked every 3-5 years. If you are 86 years of age or older, have your blood pressure checked every year. You should have your blood pressure measured twice-once when you are at a hospital or clinic, and once when you are not at a hospital or clinic. Record the average of the two measurements. To check your blood pressure when you are not at a hospital or clinic, you can use: ? An automated blood pressure machine at a pharmacy. ? A home blood pressure monitor.  If  you are between 34 years and 33 years old, ask your health care provider if you should take aspirin to prevent strokes.  Have regular diabetes screenings. This involves taking a blood sample to check your fasting blood sugar level. ? If you are at a normal weight and have a low risk for diabetes, have this test once every three years after 73 years of age. ? If you are overweight and have a high risk for diabetes, consider being tested at a younger age or more often. Preventing infection Hepatitis B  If you have a higher risk for hepatitis B, you should be screened for this virus. You are considered at high risk for hepatitis B if: ? You were born in a country where hepatitis B is common. Ask your health care provider which countries are considered high risk. ? Your parents were born in a high-risk country, and you have not been immunized against hepatitis B (hepatitis B vaccine). ? You have HIV or AIDS. ? You use needles to inject street drugs. ? You live with someone who has hepatitis B. ? You have had sex with someone who has hepatitis B. ? You get hemodialysis treatment. ? You take certain medicines for conditions, including cancer, organ transplantation, and autoimmune conditions.  Hepatitis C  Blood testing is recommended for: ? Everyone born from [birth year] through 1966. ? Anyone with known risk factors for hepatitis C.  Sexually transmitted infections (STIs)  You should be screened for sexually transmitted infections (STIs) including gonorrhea and chlamydia if: ? You are sexually active and are younger than 73 years of age. ? You are older than 73 years of age and your health care provider tells you that you are at risk for this type of infection. ? Your sexual activity has changed since you were last screened and you are at an increased risk for chlamydia or gonorrhea. Ask your health care provider if you are at risk.  If you do not have HIV, but are at risk, it may be recommended  that you take a prescription medicine daily to prevent HIV infection. This is called pre-exposure prophylaxis (PrEP). You are considered at risk if: ? You are sexually active and do not regularly use condoms or know the HIV status of your partner(s). ? You take drugs by injection. ? You are sexually active with a partner who has HIV.  Talk with your health care provider about whether you are at high risk of being infected with HIV. If you choose to begin PrEP, you  should first be tested for HIV. You should then be tested every 3 months for as long as you are taking PrEP. Pregnancy  If you are premenopausal and you may become pregnant, ask your health care provider about preconception counseling.  If you may become pregnant, take 400 to 800 micrograms (mcg) of folic acid every day.  If you want to prevent pregnancy, talk to your health care provider about birth control (contraception). Osteoporosis and menopause  Osteoporosis is a disease in which the bones lose minerals and strength with aging. This can result in serious bone fractures. Your risk for osteoporosis can be identified using a bone density scan.  If you are 65 years of age or older, or if you are at risk for osteoporosis and fractures, ask your health care provider if you should be screened.  Ask your health care provider whether you should take a calcium or vitamin D supplement to lower your risk for osteoporosis.  Menopause may have certain physical symptoms and risks.  Hormone replacement therapy may reduce some of these symptoms and risks. Talk to your health care provider about whether hormone replacement therapy is right for you. Follow these instructions at home:  Schedule regular health, dental, and eye exams.  Stay current with your immunizations.  Do not use any tobacco products including cigarettes, chewing tobacco, or electronic cigarettes.  If you are pregnant, do not drink alcohol.  If you are  breastfeeding, limit how much and how often you drink alcohol.  Limit alcohol intake to no more than 1 drink per day for nonpregnant women. One drink equals 12 ounces of beer, 5 ounces of wine, or 1 ounces of hard liquor.  Do not use street drugs.  Do not share needles.  Ask your health care provider for help if you need support or information about quitting drugs.  Tell your health care provider if you often feel depressed.  Tell your health care provider if you have ever been abused or do not feel safe at home. This information is not intended to replace advice given to you by your health care provider. Make sure you discuss any questions you have with your health care provider. Document Released: 09/04/2010 Document Revised: 07/28/2015 Document Reviewed: 11/23/2014 Elsevier Interactive Patient Education  2018 Elsevier Inc.  

## 2017-07-16 ENCOUNTER — Other Ambulatory Visit (HOSPITAL_COMMUNITY)
Admission: RE | Admit: 2017-07-16 | Discharge: 2017-07-16 | Disposition: A | Discharge status: Home / Self Care | Payer: Medicare Other | Source: Ambulatory Visit | Attending: Urology | Admitting: Urology

## 2017-07-16 ENCOUNTER — Ambulatory Visit (HOSPITAL_COMMUNITY)
Admission: RE | Admit: 2017-07-16 | Discharge: 2017-07-16 | Disposition: A | Discharge status: Home / Self Care | Payer: Medicare Other | Source: Ambulatory Visit | Attending: Urology | Admitting: Urology

## 2017-07-16 ENCOUNTER — Ambulatory Visit (INDEPENDENT_AMBULATORY_CARE_PROVIDER_SITE_OTHER): Payer: Medicare Other | Admitting: Urology

## 2017-07-16 ENCOUNTER — Other Ambulatory Visit: Payer: Self-pay | Admitting: Urology

## 2017-07-16 DIAGNOSIS — N200 Calculus of kidney: Secondary | ICD-10-CM

## 2017-07-16 DIAGNOSIS — R1901 Right upper quadrant abdominal swelling, mass and lump: Secondary | ICD-10-CM | POA: Insufficient documentation

## 2017-07-18 LAB — URINE CULTURE: Culture: NO GROWTH

## 2017-07-30 DIAGNOSIS — N200 Calculus of kidney: Secondary | ICD-10-CM | POA: Diagnosis not present

## 2017-07-31 ENCOUNTER — Other Ambulatory Visit: Payer: Self-pay | Admitting: Urology

## 2017-07-31 DIAGNOSIS — N200 Calculus of kidney: Secondary | ICD-10-CM

## 2017-08-07 DIAGNOSIS — M9901 Segmental and somatic dysfunction of cervical region: Secondary | ICD-10-CM | POA: Diagnosis not present

## 2017-08-07 DIAGNOSIS — M5033 Other cervical disc degeneration, cervicothoracic region: Secondary | ICD-10-CM | POA: Diagnosis not present

## 2017-08-08 DIAGNOSIS — M9901 Segmental and somatic dysfunction of cervical region: Secondary | ICD-10-CM | POA: Diagnosis not present

## 2017-08-08 DIAGNOSIS — M5033 Other cervical disc degeneration, cervicothoracic region: Secondary | ICD-10-CM | POA: Diagnosis not present

## 2017-08-12 DIAGNOSIS — M5033 Other cervical disc degeneration, cervicothoracic region: Secondary | ICD-10-CM | POA: Diagnosis not present

## 2017-08-12 DIAGNOSIS — M9901 Segmental and somatic dysfunction of cervical region: Secondary | ICD-10-CM | POA: Diagnosis not present

## 2017-08-14 DIAGNOSIS — M5033 Other cervical disc degeneration, cervicothoracic region: Secondary | ICD-10-CM | POA: Diagnosis not present

## 2017-08-14 DIAGNOSIS — M9901 Segmental and somatic dysfunction of cervical region: Secondary | ICD-10-CM | POA: Diagnosis not present

## 2017-08-15 DIAGNOSIS — M9901 Segmental and somatic dysfunction of cervical region: Secondary | ICD-10-CM | POA: Diagnosis not present

## 2017-08-15 DIAGNOSIS — M5033 Other cervical disc degeneration, cervicothoracic region: Secondary | ICD-10-CM | POA: Diagnosis not present

## 2017-08-16 ENCOUNTER — Ambulatory Visit (HOSPITAL_COMMUNITY)
Admission: RE | Admit: 2017-08-16 | Discharge: 2017-08-16 | Disposition: A | Discharge status: Home / Self Care | Payer: Medicare Other | Source: Ambulatory Visit | Attending: Urology | Admitting: Urology

## 2017-08-16 DIAGNOSIS — K5730 Diverticulosis of large intestine without perforation or abscess without bleeding: Secondary | ICD-10-CM | POA: Diagnosis not present

## 2017-08-16 DIAGNOSIS — N261 Atrophy of kidney (terminal): Secondary | ICD-10-CM | POA: Insufficient documentation

## 2017-08-16 DIAGNOSIS — K8020 Calculus of gallbladder without cholecystitis without obstruction: Secondary | ICD-10-CM | POA: Diagnosis not present

## 2017-08-16 DIAGNOSIS — N200 Calculus of kidney: Secondary | ICD-10-CM | POA: Diagnosis not present

## 2017-08-19 DIAGNOSIS — M9901 Segmental and somatic dysfunction of cervical region: Secondary | ICD-10-CM | POA: Diagnosis not present

## 2017-08-19 DIAGNOSIS — M5033 Other cervical disc degeneration, cervicothoracic region: Secondary | ICD-10-CM | POA: Diagnosis not present

## 2017-08-21 DIAGNOSIS — M9901 Segmental and somatic dysfunction of cervical region: Secondary | ICD-10-CM | POA: Diagnosis not present

## 2017-08-21 DIAGNOSIS — M5033 Other cervical disc degeneration, cervicothoracic region: Secondary | ICD-10-CM | POA: Diagnosis not present

## 2017-08-22 DIAGNOSIS — M9901 Segmental and somatic dysfunction of cervical region: Secondary | ICD-10-CM | POA: Diagnosis not present

## 2017-08-22 DIAGNOSIS — M5033 Other cervical disc degeneration, cervicothoracic region: Secondary | ICD-10-CM | POA: Diagnosis not present

## 2017-08-26 DIAGNOSIS — M9901 Segmental and somatic dysfunction of cervical region: Secondary | ICD-10-CM | POA: Diagnosis not present

## 2017-08-26 DIAGNOSIS — M5033 Other cervical disc degeneration, cervicothoracic region: Secondary | ICD-10-CM | POA: Diagnosis not present

## 2017-09-09 DIAGNOSIS — M5033 Other cervical disc degeneration, cervicothoracic region: Secondary | ICD-10-CM | POA: Diagnosis not present

## 2017-09-09 DIAGNOSIS — M9901 Segmental and somatic dysfunction of cervical region: Secondary | ICD-10-CM | POA: Diagnosis not present

## 2017-09-11 DIAGNOSIS — M9901 Segmental and somatic dysfunction of cervical region: Secondary | ICD-10-CM | POA: Diagnosis not present

## 2017-09-11 DIAGNOSIS — M5033 Other cervical disc degeneration, cervicothoracic region: Secondary | ICD-10-CM | POA: Diagnosis not present

## 2017-09-12 DIAGNOSIS — M9901 Segmental and somatic dysfunction of cervical region: Secondary | ICD-10-CM | POA: Diagnosis not present

## 2017-09-12 DIAGNOSIS — M5033 Other cervical disc degeneration, cervicothoracic region: Secondary | ICD-10-CM | POA: Diagnosis not present

## 2017-09-26 DIAGNOSIS — R591 Generalized enlarged lymph nodes: Secondary | ICD-10-CM | POA: Diagnosis not present

## 2017-10-09 DIAGNOSIS — H53039 Strabismic amblyopia, unspecified eye: Secondary | ICD-10-CM | POA: Diagnosis not present

## 2017-10-09 DIAGNOSIS — H353132 Nonexudative age-related macular degeneration, bilateral, intermediate dry stage: Secondary | ICD-10-CM | POA: Diagnosis not present

## 2017-10-09 DIAGNOSIS — H2513 Age-related nuclear cataract, bilateral: Secondary | ICD-10-CM | POA: Diagnosis not present

## 2017-10-21 LAB — HM DIABETES EYE EXAM

## 2017-10-22 ENCOUNTER — Ambulatory Visit (INDEPENDENT_AMBULATORY_CARE_PROVIDER_SITE_OTHER): Payer: Medicare Other | Admitting: Urology

## 2017-10-22 DIAGNOSIS — N200 Calculus of kidney: Secondary | ICD-10-CM

## 2017-10-24 DIAGNOSIS — H353132 Nonexudative age-related macular degeneration, bilateral, intermediate dry stage: Secondary | ICD-10-CM | POA: Diagnosis not present

## 2017-10-28 ENCOUNTER — Ambulatory Visit (INDEPENDENT_AMBULATORY_CARE_PROVIDER_SITE_OTHER): Payer: Medicare Other | Admitting: Family Medicine

## 2017-10-28 ENCOUNTER — Encounter: Payer: Self-pay | Admitting: Family Medicine

## 2017-10-28 ENCOUNTER — Other Ambulatory Visit: Payer: Self-pay

## 2017-10-28 VITALS — BP 123/82 | HR 81 | Temp 98.2°F | Resp 16 | Ht 63.0 in | Wt 191.2 lb

## 2017-10-28 DIAGNOSIS — E781 Pure hyperglyceridemia: Secondary | ICD-10-CM

## 2017-10-28 DIAGNOSIS — E669 Obesity, unspecified: Secondary | ICD-10-CM

## 2017-10-28 DIAGNOSIS — M810 Age-related osteoporosis without current pathological fracture: Secondary | ICD-10-CM

## 2017-10-28 DIAGNOSIS — E119 Type 2 diabetes mellitus without complications: Secondary | ICD-10-CM | POA: Diagnosis not present

## 2017-10-28 LAB — CBC WITH DIFFERENTIAL/PLATELET
BASOS ABS: 0.1 10*3/uL (ref 0.0–0.1)
Basophils Relative: 1 % (ref 0.0–3.0)
EOS ABS: 0.3 10*3/uL (ref 0.0–0.7)
Eosinophils Relative: 4.7 % (ref 0.0–5.0)
HCT: 44.4 % (ref 36.0–46.0)
Hemoglobin: 15.1 g/dL — ABNORMAL HIGH (ref 12.0–15.0)
LYMPHS PCT: 35.2 % (ref 12.0–46.0)
Lymphs Abs: 2.1 10*3/uL (ref 0.7–4.0)
MCHC: 34 g/dL (ref 30.0–36.0)
MCV: 93 fl (ref 78.0–100.0)
MONO ABS: 0.5 10*3/uL (ref 0.1–1.0)
Monocytes Relative: 8 % (ref 3.0–12.0)
NEUTROS ABS: 3.1 10*3/uL (ref 1.4–7.7)
NEUTROS PCT: 51.1 % (ref 43.0–77.0)
PLATELETS: 255 10*3/uL (ref 150.0–400.0)
RBC: 4.77 Mil/uL (ref 3.87–5.11)
RDW: 13 % (ref 11.5–15.5)
WBC: 6 10*3/uL (ref 4.0–10.5)

## 2017-10-28 LAB — LIPID PANEL
CHOL/HDL RATIO: 3
CHOLESTEROL: 149 mg/dL (ref 0–200)
HDL: 53.4 mg/dL (ref >39.00)
NONHDL: 95.99
Triglycerides: 280 mg/dL — ABNORMAL HIGH (ref 0.0–149.0)
VLDL: 56 mg/dL — AB (ref 0.0–40.0)

## 2017-10-28 LAB — BASIC METABOLIC PANEL
BUN: 13 mg/dL (ref 6–23)
CO2: 30 mEq/L (ref 19–32)
Calcium: 9.6 mg/dL (ref 8.4–10.5)
Chloride: 100 mEq/L (ref 96–112)
Creatinine, Ser: 0.87 mg/dL (ref 0.40–1.20)
GFR: 67.84 mL/min (ref >60.00)
Glucose, Bld: 172 mg/dL — ABNORMAL HIGH (ref 70–99)
POTASSIUM: 4.7 meq/L (ref 3.5–5.1)
SODIUM: 136 meq/L (ref 135–145)

## 2017-10-28 LAB — HEPATIC FUNCTION PANEL
ALK PHOS: 36 U/L — AB (ref 39–117)
ALT: 35 U/L (ref 0–35)
AST: 18 U/L (ref 0–37)
Albumin: 4.1 g/dL (ref 3.5–5.2)
BILIRUBIN TOTAL: 1 mg/dL (ref 0.2–1.2)
Bilirubin, Direct: 0.2 mg/dL (ref 0.0–0.3)
TOTAL PROTEIN: 6.9 g/dL (ref 6.0–8.3)

## 2017-10-28 LAB — TSH: TSH: 2.21 u[IU]/mL (ref 0.35–4.50)

## 2017-10-28 LAB — HEMOGLOBIN A1C: Hgb A1c MFr Bld: 7.2 % — ABNORMAL HIGH (ref 4.6–6.5)

## 2017-10-28 LAB — MICROALBUMIN / CREATININE URINE RATIO
CREATININE, U: 39.1 mg/dL
MICROALB/CREAT RATIO: 1.8 mg/g (ref 0.0–30.0)

## 2017-10-28 LAB — LDL CHOLESTEROL, DIRECT: Direct LDL: 85 mg/dL

## 2017-10-28 LAB — VITAMIN D 25 HYDROXY (VIT D DEFICIENCY, FRACTURES): VITD: 32.45 ng/mL (ref 30.00–100.00)

## 2017-10-28 MED ORDER — DENOSUMAB 60 MG/ML ~~LOC~~ SOSY
60.0000 mg | PREFILLED_SYRINGE | Freq: Once | SUBCUTANEOUS | Status: AC
  Administered 2017-10-28: 60 mg via SUBCUTANEOUS

## 2017-10-28 NOTE — Patient Instructions (Signed)
Follow up in 6 months to recheck sugar and cholesterol We'll notify you of your lab results and make any changes if needed Continue to work on healthy diet and regular exercise- you can do it! Call with any questions or concerns Happy Early Birthday!

## 2017-10-28 NOTE — Assessment & Plan Note (Signed)
Chronic problem.  Currently diet controlled.  Asymptomatic at this time.  UTD on eye exam.  Foot exam done today.  Microalbumin ordered.  Check labs and adjust tx plan prn

## 2017-10-28 NOTE — Assessment & Plan Note (Signed)
Ongoing issue for pt.  She has gained 5 lbs since last visit.  Stressed need for healthy diet and regular exercise.  Will continue to follow. 

## 2017-10-28 NOTE — Assessment & Plan Note (Signed)
Chronic problem.  On Fish oil daily.  Check labs.  Adjust meds prn

## 2017-10-28 NOTE — Progress Notes (Signed)
   Subjective:    Patient ID: Sandra Bailey, female    DOB: 1945-08-17, 72 y.o.   MRN: [id number]  HPI DM- chronic problem, diet controlled.  UTD on eye exam.  Due for foot exam and microalbumin.  No regular exercise.  No CP, SOB, HAs, visual changes, abd pain, N/V.  Denies numbness/tingling of hands/feet.  Osteoporosis- chronic problem, prolia injxn due today.  Due for Vit D level.  Hypertriglyceridemia- chronic problem, on Fish Oil daily.  Pt has gained 5 lbs since last visit.  Obesity- pt has gained 5 lbs since last visit.  No regular exercise- lifestyle is more sedentary.     Review of Systems For ROS see HPI     Objective:   Physical Exam  Constitutional: She is oriented to person, place, and time. She appears well-developed and well-nourished. No distress.  obese  HENT:  Head: Normocephalic and atraumatic.  Eyes: Pupils are equal, round, and reactive to light. Conjunctivae and EOM are normal.  Neck: Normal range of motion. Neck supple. No thyromegaly present.  Cardiovascular: Normal rate, regular rhythm, normal heart sounds and intact distal pulses.  No murmur heard. Pulmonary/Chest: Effort normal and breath sounds normal. No respiratory distress.  Abdominal: Soft. She exhibits no distension. There is no tenderness.  Musculoskeletal: She exhibits no edema.  Lymphadenopathy:    She has no cervical adenopathy.  Neurological: She is alert and oriented to person, place, and time.  Skin: Skin is warm and dry.  Psychiatric: She has a normal mood and affect. Her behavior is normal.  Vitals reviewed.         Assessment & Plan:

## 2017-10-28 NOTE — Assessment & Plan Note (Signed)
Chronic problem.  Prolia injxn given today.  Check Vit D and replete prn.

## 2017-10-29 ENCOUNTER — Encounter: Payer: Self-pay | Admitting: General Practice

## 2017-12-12 ENCOUNTER — Telehealth: Payer: Self-pay | Admitting: Family Medicine

## 2017-12-12 DIAGNOSIS — M810 Age-related osteoporosis without current pathological fracture: Secondary | ICD-10-CM

## 2017-12-12 NOTE — Telephone Encounter (Signed)
[place] for referral for DEXA- dx osteoporosis

## 2017-12-12 NOTE — Telephone Encounter (Signed)
Please advise 

## 2017-12-12 NOTE — Telephone Encounter (Signed)
Can we enter an order for this     Copied from [place] [phone]. Topic: Referral - Request for Referral >> Dec 12, 2017  1:41 PM Sandra Bailey wrote: Has patient seen PCP for this complaint? Yes Preferred provider/office: [facility] Reason for referral: Bone Density  Phone # [phone] Fax # [phone]

## 2017-12-12 NOTE — Telephone Encounter (Signed)
Dexa ordered and faxed to provided fax number

## 2018-01-13 ENCOUNTER — Telehealth: Payer: Self-pay | Admitting: General Practice

## 2018-01-13 DIAGNOSIS — M810 Age-related osteoporosis without current pathological fracture: Secondary | ICD-10-CM

## 2018-01-13 NOTE — Telephone Encounter (Signed)
Pt should have DEXA for osteoporosis

## 2018-01-13 NOTE — Telephone Encounter (Signed)
Please advise on orders and SIG.   Copied from [place] [phone]. Topic: General - Other >> Jan 13, 2018  2:43 PM Carolyn Stare wrote:  Pt call to say she has her Bone Density scheduled at [facility] >> Jan 13, 2018  3:01 PM Katina Dung, [place] wrote: Sandra Bailey

## 2018-01-13 NOTE — Telephone Encounter (Signed)
These orders were placed today.  

## 2018-01-21 DIAGNOSIS — L570 Actinic keratosis: Secondary | ICD-10-CM | POA: Diagnosis not present

## 2018-01-24 ENCOUNTER — Encounter: Payer: Self-pay | Admitting: Family Medicine

## 2018-01-24 DIAGNOSIS — Z13820 Encounter for screening for osteoporosis: Secondary | ICD-10-CM | POA: Diagnosis not present

## 2018-01-24 LAB — HM DEXA SCAN

## 2018-01-29 ENCOUNTER — Encounter: Payer: Self-pay | Admitting: General Practice

## 2018-02-24 ENCOUNTER — Telehealth: Payer: Self-pay | Admitting: Family Medicine

## 2018-02-24 NOTE — Telephone Encounter (Signed)
Called pt asking her to call me back to see if I could help her in your absents.   Copied from [place] [id number]. Topic: General - Other >> Feb 24, 2018 10:01 AM Sheran Luz wrote: Reason for CRM: Patient is requesting a call back from [place] regarding a change in her insurance policy. Patient would not disclose any other information. Please advise.

## 2018-02-27 NOTE — Telephone Encounter (Signed)
Called and spoke with pt regarding her insurance question.

## 2018-03-06 ENCOUNTER — Telehealth: Payer: Self-pay | Admitting: Family Medicine

## 2018-03-06 DIAGNOSIS — I77810 Thoracic aortic ectasia: Secondary | ICD-10-CM

## 2018-03-06 NOTE — Telephone Encounter (Signed)
Referral placed.

## 2018-03-06 NOTE — Telephone Encounter (Signed)
Received call from pt stating that due to new insurance [facility]) that she needs a referral for a follow up appt to see Dr. Gibson Ramp with [facility] for 03/19/18.

## 2018-03-18 NOTE — Telephone Encounter (Signed)
Patient states that Dr Real Cons office stated to her that they need to fax the referral to there office because they do not have the referral. Please Advise. [phone]. Her appt is at 8:30am tomorrow.

## 2018-03-19 DIAGNOSIS — I483 Typical atrial flutter: Secondary | ICD-10-CM | POA: Diagnosis not present

## 2018-03-19 DIAGNOSIS — I10 Essential (primary) hypertension: Secondary | ICD-10-CM | POA: Diagnosis not present

## 2018-03-19 DIAGNOSIS — I4510 Unspecified right bundle-branch block: Secondary | ICD-10-CM | POA: Diagnosis not present

## 2018-03-19 NOTE — Telephone Encounter (Signed)
Resent referral to office, called and made them aware to ck their fax.

## 2018-04-09 DIAGNOSIS — I10 Essential (primary) hypertension: Secondary | ICD-10-CM | POA: Diagnosis not present

## 2018-04-28 ENCOUNTER — Encounter: Payer: Self-pay | Admitting: Family Medicine

## 2018-04-28 ENCOUNTER — Other Ambulatory Visit: Payer: Self-pay

## 2018-04-28 ENCOUNTER — Ambulatory Visit (INDEPENDENT_AMBULATORY_CARE_PROVIDER_SITE_OTHER): Payer: Medicare HMO | Admitting: Family Medicine

## 2018-04-28 VITALS — BP 130/80 | HR 71 | Temp 97.9°F | Resp 16 | Ht 63.0 in | Wt 185.0 lb

## 2018-04-28 DIAGNOSIS — E119 Type 2 diabetes mellitus without complications: Secondary | ICD-10-CM | POA: Diagnosis not present

## 2018-04-28 DIAGNOSIS — E669 Obesity, unspecified: Secondary | ICD-10-CM | POA: Diagnosis not present

## 2018-04-28 DIAGNOSIS — E781 Pure hyperglyceridemia: Secondary | ICD-10-CM

## 2018-04-28 LAB — BASIC METABOLIC PANEL
BUN: 11 mg/dL (ref 6–23)
CHLORIDE: 102 meq/L (ref 96–112)
CO2: 29 mEq/L (ref 19–32)
Calcium: 9.3 mg/dL (ref 8.4–10.5)
Creatinine, Ser: 0.82 mg/dL (ref 0.40–1.20)
GFR: 68.24 mL/min (ref >60.00)
Glucose, Bld: 154 mg/dL — ABNORMAL HIGH (ref 70–99)
POTASSIUM: 4.3 meq/L (ref 3.5–5.1)
Sodium: 139 mEq/L (ref 135–145)

## 2018-04-28 LAB — CBC WITH DIFFERENTIAL/PLATELET
BASOS ABS: 0.1 10*3/uL (ref 0.0–0.1)
BASOS PCT: 1.2 % (ref 0.0–3.0)
EOS ABS: 0.4 10*3/uL (ref 0.0–0.7)
Eosinophils Relative: 6.4 % — ABNORMAL HIGH (ref 0.0–5.0)
HCT: 45.1 % (ref 36.0–46.0)
Hemoglobin: 15.3 g/dL — ABNORMAL HIGH (ref 12.0–15.0)
LYMPHS ABS: 1.9 10*3/uL (ref 0.7–4.0)
Lymphocytes Relative: 30.4 % (ref 12.0–46.0)
MCHC: 33.9 g/dL (ref 30.0–36.0)
MCV: 93.9 fl (ref 78.0–100.0)
MONOS PCT: 8.1 % (ref 3.0–12.0)
Monocytes Absolute: 0.5 10*3/uL (ref 0.1–1.0)
NEUTROS PCT: 53.9 % (ref 43.0–77.0)
Neutro Abs: 3.3 10*3/uL (ref 1.4–7.7)
Platelets: 243 10*3/uL (ref 150.0–400.0)
RBC: 4.81 Mil/uL (ref 3.87–5.11)
RDW: 13.1 % (ref 11.5–15.5)
WBC: 6.2 10*3/uL (ref 4.0–10.5)

## 2018-04-28 LAB — HEPATIC FUNCTION PANEL
ALT: 28 U/L (ref 0–35)
AST: 17 U/L (ref 0–37)
Albumin: 4.1 g/dL (ref 3.5–5.2)
Alkaline Phosphatase: 39 U/L (ref 39–117)
BILIRUBIN DIRECT: 0.2 mg/dL (ref 0.0–0.3)
Total Bilirubin: 0.9 mg/dL (ref 0.2–1.2)
Total Protein: 6.8 g/dL (ref 6.0–8.3)

## 2018-04-28 LAB — LIPID PANEL
Cholesterol: 145 mg/dL (ref 0–200)
HDL: 56.3 mg/dL (ref >39.00)
NonHDL: 88.25
Total CHOL/HDL Ratio: 3
Triglycerides: 273 mg/dL — ABNORMAL HIGH (ref 0.0–149.0)
VLDL: 54.6 mg/dL — ABNORMAL HIGH (ref 0.0–40.0)

## 2018-04-28 LAB — LDL CHOLESTEROL, DIRECT: Direct LDL: 78 mg/dL

## 2018-04-28 LAB — TSH: TSH: 3.79 u[IU]/mL (ref 0.35–4.50)

## 2018-04-28 LAB — HEMOGLOBIN A1C: Hgb A1c MFr Bld: 7.1 % — ABNORMAL HIGH (ref 4.6–6.5)

## 2018-04-28 MED ORDER — CETIRIZINE HCL 10 MG PO TABS: 10.0000 mg | ORAL_TABLET | Freq: Every day | ORAL | 11 refills | Status: DC

## 2018-04-28 NOTE — Assessment & Plan Note (Signed)
Ongoing issue for pt.  She is down 6 lbs from last visit.  Applauded her efforts.  Check labs to risk stratify.  Will follow.

## 2018-04-28 NOTE — Progress Notes (Signed)
   Subjective:    Patient ID: Sandra Bailey, female    DOB: 03/06/1945, 73 y.o.   MRN: [id number]  HPI DM- chronic problem, currently diet controlled.  UTD on microalbumin, eye exam, foot exam.  No CP, SOB, HAs, abd pain, N/V, numbness/tingling of hands/feet.  Hypertriglyceridemia- chronic problem, on Fish Oil and attempting to control w/ diet and exercise.  Obesity- ongoing issue for pt.  Down 6 lbs since last visit.  [facility] now working on taking care of herself.  Pt reports feeling better since she started exercising.   Review of Systems For ROS see HPI     Objective:   Physical Exam Vitals signs reviewed.  Constitutional:      General: She is not in acute distress.    Appearance: She is well-developed. She is obese.  HENT:     Head: Normocephalic and atraumatic.  Eyes:     Conjunctiva/sclera: Conjunctivae normal.     Pupils: Pupils are equal, round, and reactive to light.  Neck:     Musculoskeletal: Normal range of motion and neck supple.     Thyroid: No thyromegaly.  Cardiovascular:     Rate and Rhythm: Normal rate and regular rhythm.     Heart sounds: Normal heart sounds. No murmur.  Pulmonary:     Effort: Pulmonary effort is normal. No respiratory distress.     Breath sounds: Normal breath sounds.  Abdominal:     General: There is no distension.     Palpations: Abdomen is soft.     Tenderness: There is no abdominal tenderness.  Lymphadenopathy:     Cervical: No cervical adenopathy.  Skin:    General: Skin is warm and dry.  Neurological:     Mental Status: She is alert and oriented to person, place, and time.  Psychiatric:        Behavior: Behavior normal.           Assessment & Plan:

## 2018-04-28 NOTE — Patient Instructions (Addendum)
Schedule your complete physical in 6 months We'll notify you of your lab results and make any changes if needed We will start the authorization for your repeat Prolia and let you know if there's an issue Keep up the good work on healthy diet and regular exercise- you're doing great!!! Call with any questions or concerns Happy Spring!!!

## 2018-04-28 NOTE — Assessment & Plan Note (Signed)
Chronic problem.  On Fish Oil and now exercising regularly.  Applauded her efforts.  Check labs and adjust meds prn.

## 2018-04-28 NOTE — Assessment & Plan Note (Signed)
Chronic problem.  Currently diet controlled.  Applauded her 6 lb weight loss.  UTD on foot exam, eye exam, and microalbumin.  Check labs and adjust tx plan prn.

## 2018-05-27 ENCOUNTER — Telehealth: Payer: Self-pay | Admitting: Family Medicine

## 2018-05-27 ENCOUNTER — Telehealth: Payer: Self-pay | Admitting: General Practice

## 2018-05-27 MED ORDER — ALENDRONATE SODIUM 70 MG PO TABS: 70.0000 mg | ORAL_TABLET | ORAL | 3 refills | Status: DC

## 2018-05-27 NOTE — Telephone Encounter (Signed)
Called pt and informed that per her insurance her Prolia is no longer covered. Pt was advised to contact her insurance and find out what is preferred on formulary.   [place] for [facility] to Discuss results / PCP recommendations / Schedule patient.

## 2018-05-27 NOTE — Telephone Encounter (Signed)
Please advise 

## 2018-05-27 NOTE — Telephone Encounter (Signed)
Ok to use Alendronate 70mg  weekly, #12, 3 refills

## 2018-05-27 NOTE — Telephone Encounter (Signed)
Pt calling stating that with her insurance plan the Prolia injection would be over $300.00. Pt asking if calcitriol or alendronate would be ok in place of prolia, the cost for both of these are much better and is what insurance has suggested, if ok pt would like a Rx sent to [facility] on [address] in [place].

## 2018-05-27 NOTE — Telephone Encounter (Signed)
Patient made aware. rx filled to local pharmacy as requested.

## 2018-05-27 NOTE — Addendum Note (Signed)
Addended by: Davis Gourd on: 05/27/2018 02:04 PM   Modules accepted: Orders

## 2018-06-16 ENCOUNTER — Telehealth: Payer: Self-pay

## 2018-06-16 NOTE — Telephone Encounter (Signed)
Copied from [place] [phone]. Topic: General - Inquiry >> Jun 13, 2018  4:47 PM Rainey Pines A wrote: Reason for CRM: Patient would like Levada Dy to call her back. Best contact # is [phone]

## 2018-06-20 ENCOUNTER — Telehealth: Payer: Self-pay | Admitting: Emergency Medicine

## 2018-06-20 NOTE — Telephone Encounter (Signed)
Copied from [place] [phone]. Topic: General - Inquiry >> Jun 20, 2018  2:34 PM Sandra Bailey, [place] wrote: Reason for CRM: Patient called back regarding billing issue. States she would like to deal with Levada Dy, due to knowing her insurance and billing issues previously. Call back number is [phone]

## 2018-06-23 NOTE — Telephone Encounter (Signed)
LMOVM stating that I would have Estill Bamberg take a look into this and then give pt a CB with info regarding bill.

## 2018-07-03 ENCOUNTER — Telehealth: Payer: Self-pay | Admitting: Family Medicine

## 2018-07-03 DIAGNOSIS — D485 Neoplasm of uncertain behavior of skin: Secondary | ICD-10-CM

## 2018-07-03 NOTE — Telephone Encounter (Signed)
Referral was placed for both patients.

## 2018-07-03 NOTE — Telephone Encounter (Signed)
Pt called in stating that she and her husband Kayron Kalmar have an appt with Dr. [facility] on 07/07/18. Can we please put in referrals.   Her DX code is D48.5  His DX code is [phone]

## 2018-07-07 DIAGNOSIS — L570 Actinic keratosis: Secondary | ICD-10-CM | POA: Diagnosis not present

## 2018-07-07 DIAGNOSIS — L728 Other follicular cysts of the skin and subcutaneous tissue: Secondary | ICD-10-CM | POA: Diagnosis not present

## 2018-07-07 DIAGNOSIS — L819 Disorder of pigmentation, unspecified: Secondary | ICD-10-CM | POA: Diagnosis not present

## 2018-07-10 ENCOUNTER — Telehealth: Payer: Self-pay | Admitting: Family Medicine

## 2018-07-10 DIAGNOSIS — H353132 Nonexudative age-related macular degeneration, bilateral, intermediate dry stage: Secondary | ICD-10-CM

## 2018-07-10 NOTE — Addendum Note (Signed)
Addended by: Davis Gourd on: 07/10/2018 02:51 PM   Modules accepted: Orders

## 2018-07-10 NOTE — Telephone Encounter (Signed)
This referral was placed today,

## 2018-07-10 NOTE — Telephone Encounter (Signed)
Received a call from [facility] stating that they need a referral for pt. Pt has an appt scheduled for 5/28 w/ Dr. Garwin Brothers using diagnosis code [phone].  Phone [phone] Fax      [phone]

## 2018-07-27 IMAGING — CT CT ABD-PELV W/O CM
2 of 4 series · 17 of 46 positions shown, 19 images · non-contrast
Comparison: 07/16/2017

CLINICAL DATA: History of stones.

EXAM:
CT ABDOMEN AND PELVIS WITHOUT CONTRAST
TECHNIQUE: Multidetector CT imaging of the abdomen and pelvis was performed
following the standard protocol without IV contrast.

[Series 2: axial st · axial · 0.70mm/px · z∈[-413,-33]mm · 14 of 84 slices shown, 16 images]
[im 4/84  soft-tissue]
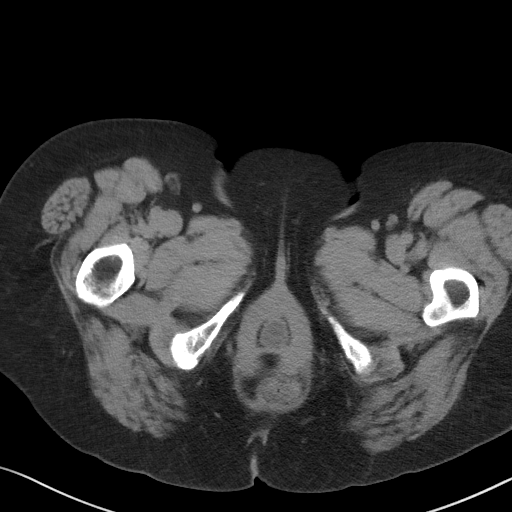
[im 4/84  bone]
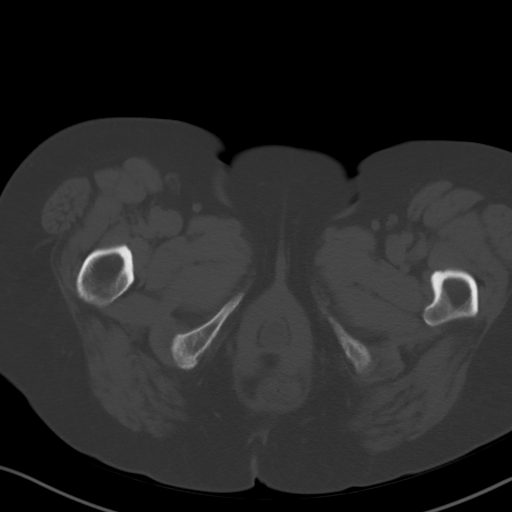
[im 11/84  soft-tissue]
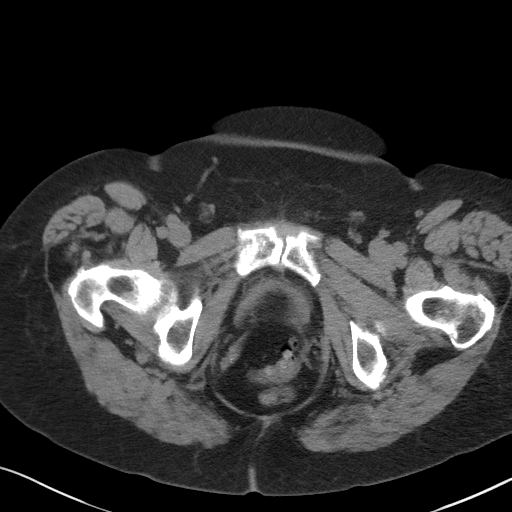
[im 18/84  soft-tissue]
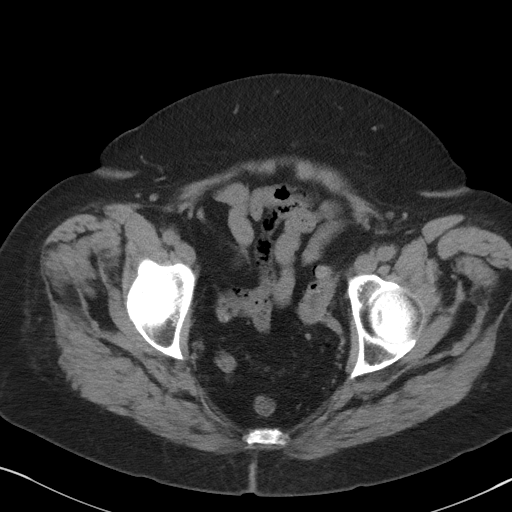
[im 21/84  soft-tissue]
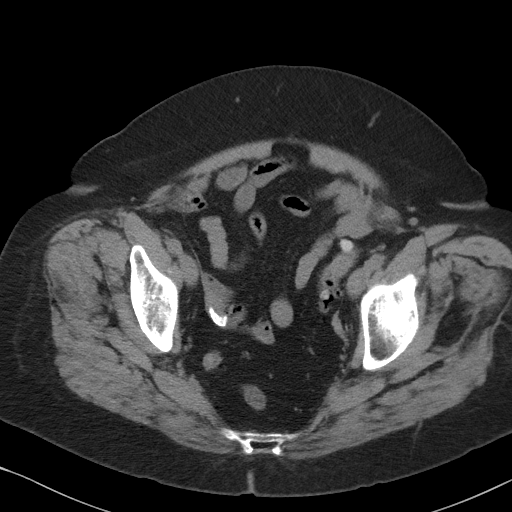
[im 28/84  soft-tissue]
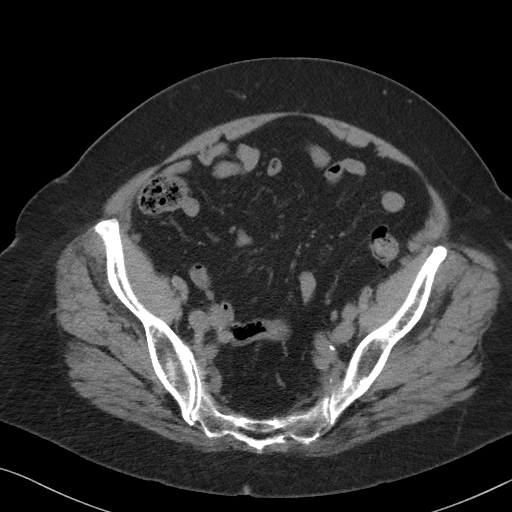
[im 35/84  soft-tissue]
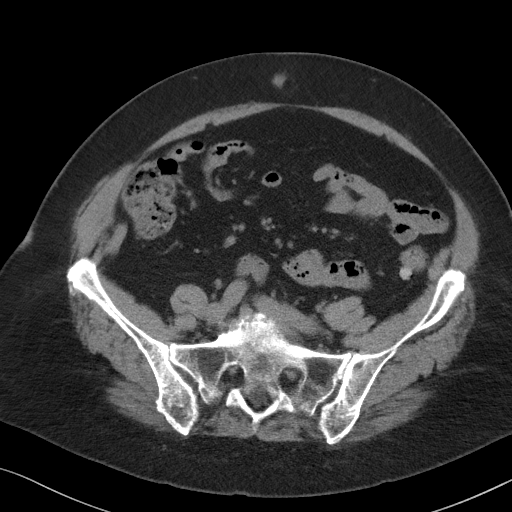
[im 39/84  soft-tissue]
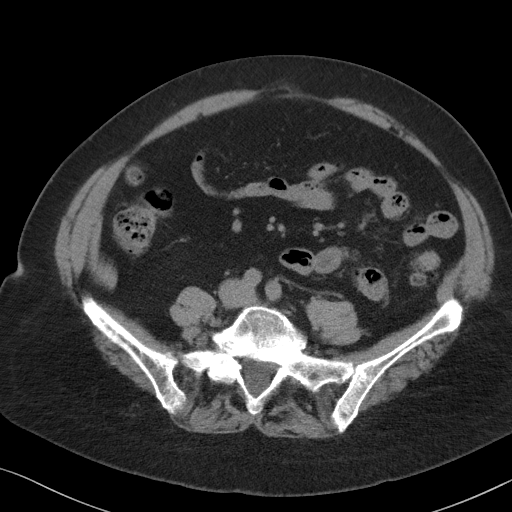
[im 45/84  soft-tissue]
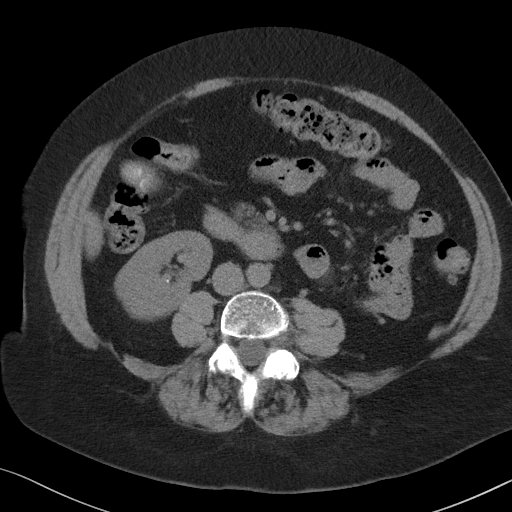
[im 49/84  soft-tissue]
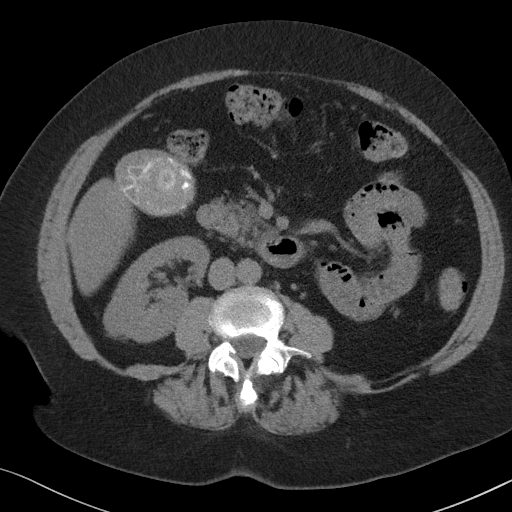
[im 49/84  bone]
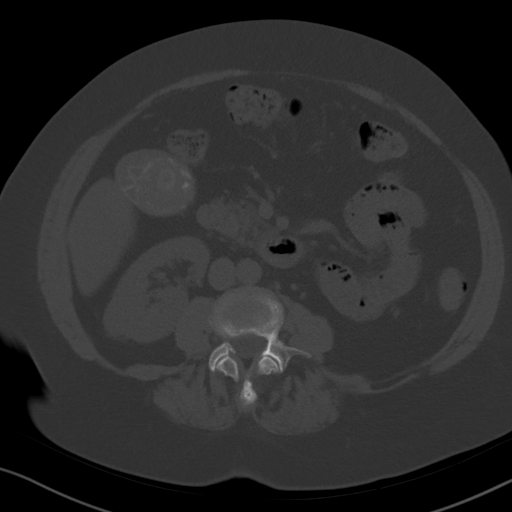
[im 56/84  soft-tissue]
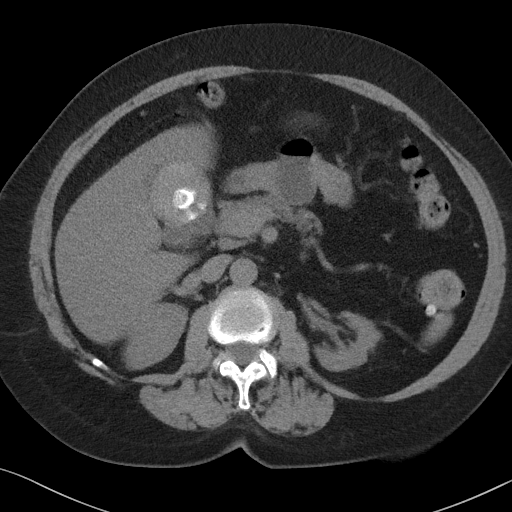
[im 63/84  soft-tissue]
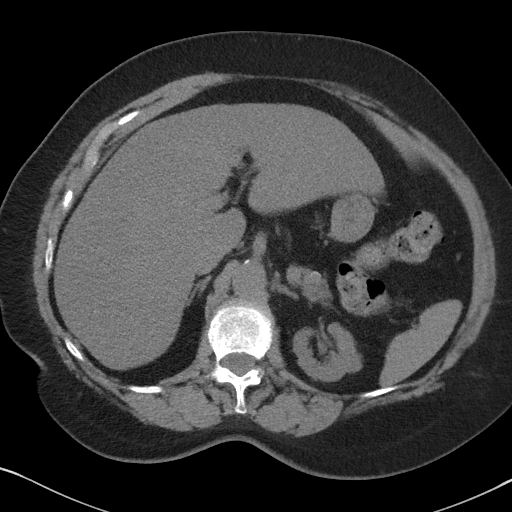
[im 66/84  soft-tissue]
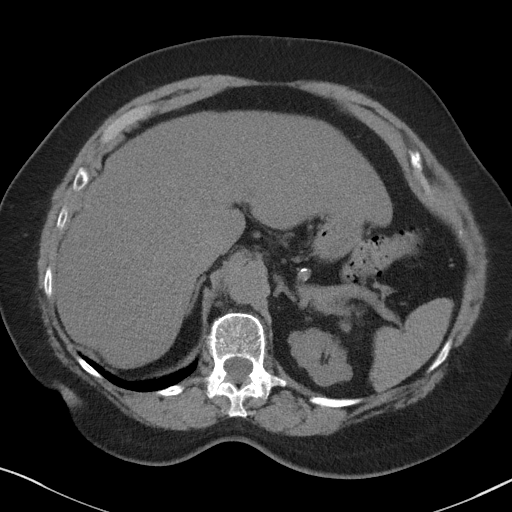
[im 73/84  soft-tissue]
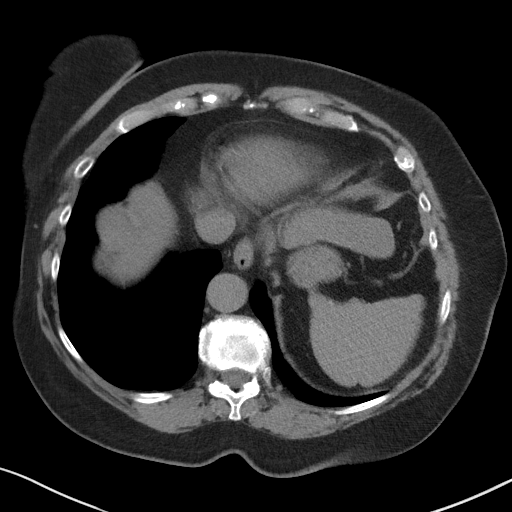
[im 80/84  soft-tissue]
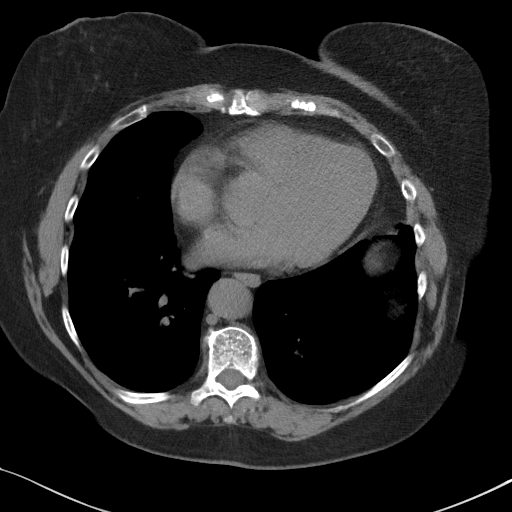

[Series 5: coronal st · coronal · 0.77mm/px · 3 of 99 slices shown]
[im 33/99  soft-tissue]
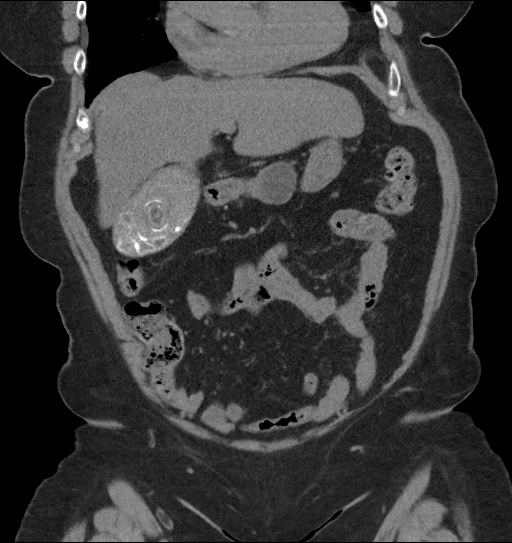
[im 44/99  soft-tissue]
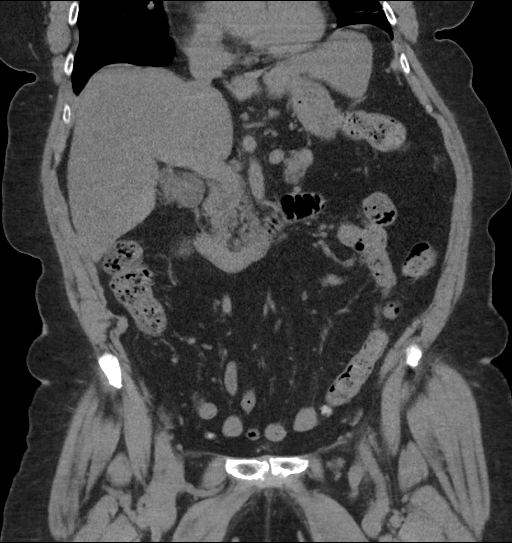
[im 55/99  soft-tissue]
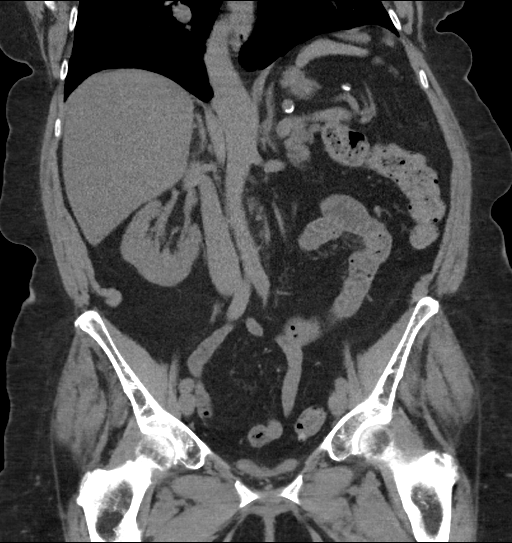

[17 of 46 positions shown; findings below may reference images not displayed]

FINDINGS: Lower chest: Mild cardiomegaly.  Lung bases clear.  No effusions.

Hepatobiliary: Numerous gallstones within the gallbladder. No focal
hepatic abnormality.

Pancreas: No focal abnormality or ductal dilatation.

Spleen: No focal abnormality.  Normal size.

Adrenals/Urinary Tract: Punctate nonobstructing stone in the lower
pole of the right kidney. Left kidney is mildly atrophic. Lobular
contours within the left kidney. Exophytic area in the midpole
laterally measures 13 mm and cannot be characterized without
intravenous contrast. No ureteral stones or hydronephrosis. Urinary
bladder decompressed. Adrenal glands unremarkable.

Stomach/Bowel: Left colonic diverticulosis. No active
diverticulitis. Stomach and small bowel decompressed, unremarkable.
Appendix is normal.

Vascular/Lymphatic: Aortic atherosclerosis. No enlarged abdominal or
pelvic lymph nodes.

Reproductive: Prior hysterectomy.  No adnexal masses.

Other: No free fluid or free air.

Musculoskeletal: No acute bony abnormality. Degenerative changes in
the lower lumbar spine.
IMPRESSION: Cholelithiasis.

Punctate right lower pole nephrolithiasis.  No hydronephrosis.

Mildly atrophic left kidney. Exophytic areas within the mid and
upper pole of the left kidney which cannot be characterized on this
noncontrast CT. This could be further evaluated with ultrasound.

Left colonic diverticulosis.  No active diverticulitis.

## 2018-09-29 ENCOUNTER — Encounter: Payer: Self-pay | Admitting: *Deleted

## 2018-09-30 ENCOUNTER — Other Ambulatory Visit: Payer: Self-pay | Admitting: *Deleted

## 2018-09-30 DIAGNOSIS — H3530 Unspecified macular degeneration: Secondary | ICD-10-CM

## 2018-10-22 LAB — HM DIABETES EYE EXAM

## 2018-10-29 ENCOUNTER — Encounter: Payer: Self-pay | Admitting: Family Medicine

## 2018-10-29 ENCOUNTER — Ambulatory Visit (INDEPENDENT_AMBULATORY_CARE_PROVIDER_SITE_OTHER): Payer: Medicare HMO | Admitting: Family Medicine

## 2018-10-29 ENCOUNTER — Other Ambulatory Visit: Payer: Self-pay

## 2018-10-29 VITALS — BP 133/86 | HR 67 | Temp 98.0°F | Resp 16 | Ht 63.0 in | Wt 185.5 lb

## 2018-10-29 DIAGNOSIS — E119 Type 2 diabetes mellitus without complications: Secondary | ICD-10-CM | POA: Diagnosis not present

## 2018-10-29 DIAGNOSIS — Z0000 Encounter for general adult medical examination without abnormal findings: Secondary | ICD-10-CM | POA: Diagnosis not present

## 2018-10-29 DIAGNOSIS — M810 Age-related osteoporosis without current pathological fracture: Secondary | ICD-10-CM | POA: Diagnosis not present

## 2018-10-29 DIAGNOSIS — Z1211 Encounter for screening for malignant neoplasm of colon: Secondary | ICD-10-CM | POA: Diagnosis not present

## 2018-10-29 LAB — CBC WITH DIFFERENTIAL/PLATELET
Basophils Absolute: 0.1 10*3/uL (ref 0.0–0.1)
Basophils Relative: 1 % (ref 0.0–3.0)
Eosinophils Absolute: 0.4 10*3/uL (ref 0.0–0.7)
Eosinophils Relative: 7.1 % — ABNORMAL HIGH (ref 0.0–5.0)
HCT: 44 % (ref 36.0–46.0)
Hemoglobin: 15.1 g/dL — ABNORMAL HIGH (ref 12.0–15.0)
Lymphocytes Relative: 30.8 % (ref 12.0–46.0)
Lymphs Abs: 1.8 10*3/uL (ref 0.7–4.0)
MCHC: 34.3 g/dL (ref 30.0–36.0)
MCV: 95 fl (ref 78.0–100.0)
Monocytes Absolute: 0.5 10*3/uL (ref 0.1–1.0)
Monocytes Relative: 8.3 % (ref 3.0–12.0)
Neutro Abs: 3.1 10*3/uL (ref 1.4–7.7)
Neutrophils Relative %: 52.8 % (ref 43.0–77.0)
Platelets: 266 10*3/uL (ref 150.0–400.0)
RBC: 4.64 Mil/uL (ref 3.87–5.11)
RDW: 13.3 % (ref 11.5–15.5)
WBC: 5.8 10*3/uL (ref 4.0–10.5)

## 2018-10-29 LAB — HEMOGLOBIN A1C: Hgb A1c MFr Bld: 7.2 % — ABNORMAL HIGH (ref 4.6–6.5)

## 2018-10-29 LAB — BASIC METABOLIC PANEL
BUN: 15 mg/dL (ref 6–23)
CO2: 25 mEq/L (ref 19–32)
Calcium: 9.3 mg/dL (ref 8.4–10.5)
Chloride: 102 mEq/L (ref 96–112)
Creatinine, Ser: 0.84 mg/dL (ref 0.40–1.20)
GFR: 66.28 mL/min (ref >60.00)
Glucose, Bld: 138 mg/dL — ABNORMAL HIGH (ref 70–99)
Potassium: 4.3 mEq/L (ref 3.5–5.1)
Sodium: 138 mEq/L (ref 135–145)

## 2018-10-29 LAB — TSH: TSH: 2.78 u[IU]/mL (ref 0.35–4.50)

## 2018-10-29 LAB — HEPATIC FUNCTION PANEL
ALT: 30 U/L (ref 0–35)
AST: 21 U/L (ref 0–37)
Albumin: 4.1 g/dL (ref 3.5–5.2)
Alkaline Phosphatase: 61 U/L (ref 39–117)
Bilirubin, Direct: 0.2 mg/dL (ref 0.0–0.3)
Total Bilirubin: 1 mg/dL (ref 0.2–1.2)
Total Protein: 6.6 g/dL (ref 6.0–8.3)

## 2018-10-29 LAB — LIPID PANEL
Cholesterol: 139 mg/dL (ref 0–200)
HDL: 56 mg/dL (ref >39.00)
LDL Cholesterol: 52 mg/dL (ref 0–99)
NonHDL: 82.94
Total CHOL/HDL Ratio: 2
Triglycerides: 154 mg/dL — ABNORMAL HIGH (ref 0.0–149.0)
VLDL: 30.8 mg/dL (ref 0.0–40.0)

## 2018-10-29 LAB — MICROALBUMIN / CREATININE URINE RATIO
Creatinine,U: 55.3 mg/dL
Microalb Creat Ratio: 1.3 mg/g (ref 0.0–30.0)
Microalb, Ur: <0.7 mg/dL (ref 0.0–1.9)

## 2018-10-29 LAB — VITAMIN D 25 HYDROXY (VIT D DEFICIENCY, FRACTURES): VITD: 43 ng/mL (ref 30.00–100.00)

## 2018-10-29 NOTE — Assessment & Plan Note (Signed)
Pt's PE unchanged from previous.  UTD on DEXA.  Pt to schedule mammo.  Pt to complete repeat cologuard.  Check labs.  Anticipatory guidance provided.

## 2018-10-29 NOTE — Assessment & Plan Note (Signed)
Chronic problem.  So far has been successfully managed w/ diet and exercise.  UTD on eye exam.  Foot exam done today.  Microalbumin pending.  Check labs.  Adjust tx plan prn.  Pt expressed understanding and is in agreement w/ plan.

## 2018-10-29 NOTE — Patient Instructions (Addendum)
Follow up in 6 months to recheck diabetes We'll notify you of your lab results and make any changes if needed Continue to work on healthy diet and regular exercise- you're doing great! SCHEDULE YOUR MAMMOGRAM! Complete the Cologuard as directed Call with any questions or concerns Stay Safe!!

## 2018-10-29 NOTE — Progress Notes (Signed)
   Subjective:    Patient ID: Sandra Bailey, female    DOB: Nov 23, 1945, 73 y.o.   MRN: [id number]  HPI CPE- UTD on DEXA.  Due for mammo, cologuard, foot exam, urine micro.  Had eye exam last week.   Review of Systems Patient reports no vision/ hearing changes, adenopathy,fever, weight change,  persistant/recurrent hoarseness , swallowing issues, chest pain, palpitations, edema, persistant/recurrent cough, hemoptysis, dyspnea (rest/exertional/paroxysmal nocturnal), gastrointestinal bleeding (melena, rectal bleeding), abdominal pain, significant heartburn, bowel changes, GU symptoms (dysuria, hematuria, incontinence), Gyn symptoms (abnormal  bleeding, pain),  syncope, focal weakness, memory loss, numbness & tingling, skin/hair/nail changes, abnormal bruising or bleeding, anxiety, or depression.     Objective:   Physical Exam General Appearance:    Alert, cooperative, no distress, appears stated age  Head:    Normocephalic, without obvious abnormality, atraumatic  Eyes:    PERRL, conjunctiva/corneas clear, EOM's intact, fundi    benign, both eyes  Ears:    Normal TM's and external ear canals, both ears  Nose:   Deferred due to COVID  Throat:   Neck:   Supple, symmetrical, trachea midline, no adenopathy;    Thyroid: no enlargement/tenderness/nodules  Back:     Symmetric, no curvature, ROM normal, no CVA tenderness  Lungs:     Clear to auscultation bilaterally, respirations unlabored  Chest Wall:    No tenderness or deformity   Heart:    Regular rate and rhythm, S1 and S2 normal, no murmur, rub   or gallop  Breast Exam:    Deferred to GYN  Abdomen:     Soft, non-tender, bowel sounds active all four quadrants,    no masses, no organomegaly  Genitalia:    Deferred to GYN  Rectal:    Extremities:   Extremities normal, atraumatic, no cyanosis or edema  Pulses:   2+ and symmetric all extremities  Skin:   Skin color, texture, turgor normal, no rashes or lesions  Lymph nodes:   Cervical,  supraclavicular, and axillary nodes normal  Neurologic:   CNII-XII intact, normal strength, sensation and reflexes    throughout          Assessment & Plan:

## 2018-10-29 NOTE — Assessment & Plan Note (Signed)
Pt has stopped bisphosphonate.  UTD on DEXA.  Check Vit D and replete prn.

## 2018-10-30 ENCOUNTER — Encounter: Payer: Self-pay | Admitting: General Practice

## 2018-11-03 ENCOUNTER — Encounter: Payer: Self-pay | Admitting: General Practice

## 2018-11-03 NOTE — Progress Notes (Signed)
Sent pt a mychart to verify.

## 2018-11-26 LAB — COLOGUARD: Cologuard: NEGATIVE

## 2018-11-27 ENCOUNTER — Encounter: Payer: Self-pay | Admitting: General Practice

## 2018-12-09 ENCOUNTER — Telehealth: Payer: Self-pay | Admitting: Family Medicine

## 2018-12-09 DIAGNOSIS — I483 Typical atrial flutter: Secondary | ICD-10-CM

## 2018-12-09 DIAGNOSIS — I499 Cardiac arrhythmia, unspecified: Secondary | ICD-10-CM

## 2018-12-09 DIAGNOSIS — R000 Tachycardia, unspecified: Secondary | ICD-10-CM

## 2018-12-09 NOTE — Telephone Encounter (Signed)
Can you put in a referral for pt to see Dr. Gibson Ramp for DX: I48.3, V81.0, R00.0, and I49.9 her appt is today.

## 2018-12-09 NOTE — Telephone Encounter (Signed)
I have faxed the referral to insurance and the Dr. Real Cons office

## 2018-12-09 NOTE — Telephone Encounter (Signed)
I placed this referral.

## 2018-12-10 ENCOUNTER — Telehealth: Payer: Self-pay | Admitting: Family Medicine

## 2018-12-10 DIAGNOSIS — H353132 Nonexudative age-related macular degeneration, bilateral, intermediate dry stage: Secondary | ICD-10-CM

## 2018-12-10 DIAGNOSIS — H53039 Strabismic amblyopia, unspecified eye: Secondary | ICD-10-CM

## 2018-12-10 DIAGNOSIS — H2513 Age-related nuclear cataract, bilateral: Secondary | ICD-10-CM

## 2018-12-10 NOTE — Telephone Encounter (Signed)
Referral placed.

## 2018-12-10 NOTE — Telephone Encounter (Signed)
Can we enter a referral for pt:  01/06/2019 @12 :55pm- [facility] Dr. Philbert Riser Phone # [phone] Fax# [phone]  Dx Code: H25.13,  [id number], [id number]   NPI# [id number] Tax ID# [id number]

## 2019-01-26 ENCOUNTER — Telehealth: Payer: Self-pay | Admitting: Family Medicine

## 2019-01-26 DIAGNOSIS — Z1231 Encounter for screening mammogram for malignant neoplasm of breast: Secondary | ICD-10-CM

## 2019-01-26 NOTE — Telephone Encounter (Signed)
Pt has an appt to get her mammogram done on 02/07/2019 at [facility] [phone]

## 2019-01-26 NOTE — Telephone Encounter (Signed)
Ok for orders? 

## 2019-01-26 NOTE — Telephone Encounter (Signed)
This order was placed today.

## 2019-01-26 NOTE — Telephone Encounter (Signed)
[place] for [employer]

## 2019-01-27 NOTE — Telephone Encounter (Signed)
Order has been faxed

## 2019-02-17 LAB — HM MAMMOGRAPHY

## 2019-03-02 ENCOUNTER — Encounter: Payer: Self-pay | Admitting: General Practice

## 2019-03-10 ENCOUNTER — Telehealth: Payer: Self-pay | Admitting: Family Medicine

## 2019-03-10 DIAGNOSIS — R922 Inconclusive mammogram: Secondary | ICD-10-CM

## 2019-03-10 NOTE — Telephone Encounter (Signed)
This Mammogram order was placed today.

## 2019-03-10 NOTE — Telephone Encounter (Signed)
Pt has an appt at [place] phone # [phone]  Her appt is on 03/18/2019 for   DX Code: R92.2  CPT [id number] and [id number]   NPI # [id number]  Fax# [phone]  Please advise

## 2019-03-10 NOTE — Telephone Encounter (Signed)
I have faxed over the order along with the Precert for the pt/ELEA

## 2019-04-14 ENCOUNTER — Encounter: Payer: Self-pay | Admitting: Family Medicine

## 2019-04-14 ENCOUNTER — Ambulatory Visit (INDEPENDENT_AMBULATORY_CARE_PROVIDER_SITE_OTHER): Payer: Medicare HMO | Admitting: Family Medicine

## 2019-04-14 ENCOUNTER — Other Ambulatory Visit: Payer: Self-pay

## 2019-04-14 VITALS — BP 126/84 | HR 80 | Temp 97.4°F | Resp 16 | Ht 63.0 in | Wt 191.5 lb

## 2019-04-14 DIAGNOSIS — R300 Dysuria: Secondary | ICD-10-CM

## 2019-04-14 DIAGNOSIS — B372 Candidiasis of skin and nail: Secondary | ICD-10-CM | POA: Diagnosis not present

## 2019-04-14 DIAGNOSIS — L608 Other nail disorders: Secondary | ICD-10-CM | POA: Diagnosis not present

## 2019-04-14 LAB — POCT URINALYSIS DIPSTICK
Bilirubin, UA: NEGATIVE
Blood, UA: NEGATIVE
Glucose, UA: NEGATIVE
Ketones, UA: NEGATIVE
Leukocytes, UA: NEGATIVE
Nitrite, UA: NEGATIVE
Protein, UA: NEGATIVE
Spec Grav, UA: 1.015 (ref 1.010–1.025)
Urobilinogen, UA: 0.2 E.U./dL
pH, UA: 6 (ref 5.0–8.0)

## 2019-04-14 MED ORDER — CICLOPIROX 8 % EX SOLN: Freq: Every day | CUTANEOUS | 0 refills | Status: DC

## 2019-04-14 MED ORDER — NYSTATIN 100000 UNIT/GM EX CREA: 1.0000 "application " | TOPICAL_CREAM | Freq: Two times a day (BID) | CUTANEOUS | 0 refills | Status: DC

## 2019-04-14 MED ORDER — CEPHALEXIN 500 MG PO CAPS: 500.0000 mg | ORAL_CAPSULE | Freq: Two times a day (BID) | ORAL | 0 refills | Status: AC

## 2019-04-14 NOTE — Patient Instructions (Addendum)
Follow up as scheduled Please have Sandra Bailey monitor the discoloration on your L toe (I will also check at next appt) START the Cephalexin twice daily for possible UTI Drink plenty of fluids Apply the Nystatin cream to the labia and groin creases 2x/day for next 7 days USE the Ciclopirox on the big toes as directed Call with any questions or concerns Stay safe!  Stay Healthy!

## 2019-04-14 NOTE — Progress Notes (Signed)
   Subjective:    Patient ID: Sandra Bailey, female    DOB: 1945/03/26, 74 y.o.   MRN: [id number]  HPI Dysuria- pt reports increased frequency, urine 'stinks', some burning w/ urination.  sxs started ~10 days ago.  No fevers.  No kidney pain.  No blood in urine  Vaginal itching- pt reports itching of labia and groin.  sxs started 'about the same time'.  Pt is unable to see the area so cannot say whether it's red or there is a rash present.  Denies vaginal d/c.  Toenail fungus- husband has been cutting her toenails and told her she had a fungus.  Used OTC tx and now nails are peeling.  Great toes bilaterally.    Review of Systems For ROS see HPI   This visit occurred during the SARS-CoV-2 public health emergency.  Safety protocols were in place, including screening questions prior to the visit, additional usage of staff PPE, and extensive cleaning of exam room while observing appropriate contact time as indicated for disinfecting solutions.       Objective:   Physical Exam Vitals reviewed.  Constitutional:      General: She is not in acute distress.    Appearance: Normal appearance. She is well-developed. She is obese.  HENT:     Head: Normocephalic and atraumatic.  Abdominal:     General: There is no distension.     Palpations: Abdomen is soft.     Tenderness: There is no abdominal tenderness (no suprapubic or CVA tenderness).  Skin:    General: Skin is warm and dry.     Comments: Yeast dermatitis present in groin creases bilaterally Thickened, yellowed great toenails bilaterally consistent w/ fungal infxn Dark, discolored area along medial nail edge at base of L great toenail  Neurological:     General: No focal deficit present.     Mental Status: She is alert and oriented to person, place, and time.           Assessment & Plan:  Dysuria- new.  Pt reports sxs similar to previous UTIs.  UA WNL but given sxs will send for culture and start empiric abx.  Pt expressed  understanding and is in agreement w/ plan.   Yeast dermatitis- new.  Start topical Nystatin.  Pt expressed understanding and is in agreement w/ plan.   Nail discoloration- the thickening and yellowing of distal nails is consistent w/ nail fungus.  The hyperpigmented area of the proximal nail is either a bruise that pt is unaware of or an underlying skin lesion.  Encouraged her to have husband monitor and I will check at her visit next month to see if discoloration has grown out or remains fixed.  Start Ciclopirox.  Pt expressed understanding and is in agreement w/ plan.

## 2019-04-15 LAB — URINE CULTURE
MICRO NUMBER:: 10133202
SPECIMEN QUALITY:: ADEQUATE

## 2019-05-04 ENCOUNTER — Ambulatory Visit: Payer: Medicare HMO | Admitting: Family Medicine

## 2019-05-05 ENCOUNTER — Ambulatory Visit (INDEPENDENT_AMBULATORY_CARE_PROVIDER_SITE_OTHER): Payer: Medicare HMO | Admitting: Family Medicine

## 2019-05-05 ENCOUNTER — Encounter: Payer: Self-pay | Admitting: Family Medicine

## 2019-05-05 ENCOUNTER — Other Ambulatory Visit: Payer: Self-pay

## 2019-05-05 VITALS — BP 126/84 | HR 67 | Temp 98.0°F | Resp 16 | Ht 63.0 in | Wt 189.5 lb

## 2019-05-05 DIAGNOSIS — E119 Type 2 diabetes mellitus without complications: Secondary | ICD-10-CM

## 2019-05-05 DIAGNOSIS — E781 Pure hyperglyceridemia: Secondary | ICD-10-CM

## 2019-05-05 DIAGNOSIS — E669 Obesity, unspecified: Secondary | ICD-10-CM | POA: Diagnosis not present

## 2019-05-05 LAB — TSH: TSH: 2.27 u[IU]/mL (ref 0.35–4.50)

## 2019-05-05 LAB — CBC WITH DIFFERENTIAL/PLATELET
Basophils Absolute: 0.1 10*3/uL (ref 0.0–0.1)
Basophils Relative: 1.3 % (ref 0.0–3.0)
Eosinophils Absolute: 0.3 10*3/uL (ref 0.0–0.7)
Eosinophils Relative: 5 % (ref 0.0–5.0)
HCT: 42.2 % (ref 36.0–46.0)
Hemoglobin: 14.5 g/dL (ref 12.0–15.0)
Lymphocytes Relative: 30.5 % (ref 12.0–46.0)
Lymphs Abs: 1.9 10*3/uL (ref 0.7–4.0)
MCHC: 34.3 g/dL (ref 30.0–36.0)
MCV: 93.6 fl (ref 78.0–100.0)
Monocytes Absolute: 0.5 10*3/uL (ref 0.1–1.0)
Monocytes Relative: 8.9 % (ref 3.0–12.0)
Neutro Abs: 3.3 10*3/uL (ref 1.4–7.7)
Neutrophils Relative %: 54.3 % (ref 43.0–77.0)
Platelets: 218 10*3/uL (ref 150.0–400.0)
RBC: 4.51 Mil/uL (ref 3.87–5.11)
RDW: 12.8 % (ref 11.5–15.5)
WBC: 6.2 10*3/uL (ref 4.0–10.5)

## 2019-05-05 LAB — BASIC METABOLIC PANEL
BUN: 12 mg/dL (ref 6–23)
CO2: 27 mEq/L (ref 19–32)
Calcium: 9.3 mg/dL (ref 8.4–10.5)
Chloride: 100 mEq/L (ref 96–112)
Creatinine, Ser: 0.79 mg/dL (ref 0.40–1.20)
GFR: 71.04 mL/min (ref >60.00)
Glucose, Bld: 275 mg/dL — ABNORMAL HIGH (ref 70–99)
Potassium: 4.4 mEq/L (ref 3.5–5.1)
Sodium: 134 mEq/L — ABNORMAL LOW (ref 135–145)

## 2019-05-05 LAB — LIPID PANEL
Cholesterol: 131 mg/dL (ref 0–200)
HDL: 54.3 mg/dL (ref >39.00)
LDL Cholesterol: 54 mg/dL (ref 0–99)
NonHDL: 76.94
Total CHOL/HDL Ratio: 2
Triglycerides: 117 mg/dL (ref 0.0–149.0)
VLDL: 23.4 mg/dL (ref 0.0–40.0)

## 2019-05-05 LAB — HEPATIC FUNCTION PANEL
ALT: 27 U/L (ref 0–35)
AST: 16 U/L (ref 0–37)
Albumin: 3.8 g/dL (ref 3.5–5.2)
Alkaline Phosphatase: 68 U/L (ref 39–117)
Bilirubin, Direct: 0.2 mg/dL (ref 0.0–0.3)
Total Bilirubin: 1.4 mg/dL — ABNORMAL HIGH (ref 0.2–1.2)
Total Protein: 6.5 g/dL (ref 6.0–8.3)

## 2019-05-05 LAB — HEMOGLOBIN A1C: Hgb A1c MFr Bld: 9.8 % — ABNORMAL HIGH (ref 4.6–6.5)

## 2019-05-05 NOTE — Patient Instructions (Addendum)
Schedule your complete physical in 6 months We'll notify you of your lab results and make any changes if needed Continue to work on healthy diet and regular exercise- you can do it! Call with any questions or concerns Get your COVID vaccine ASAP Stay Safe!  Stay Healthy!

## 2019-05-05 NOTE — Progress Notes (Signed)
   Subjective:    Patient ID: Sandra Bailey, female    DOB: 19-Apr-1945, 74 y.o.   MRN: [id number]  HPI DM- chronic problem.  Attempting to control w/ diet and exercise.  Pt is down 2 lbs since last visit.  UTD on foot exam, eye exam, microalbumin.  'I had a cinnamon bun this big yesterday'.  No CP, SOB, HAs, visual changes, abd pain, N/V.  Denies numbness/tingling of hands/feet.  Hypertriglyceridemia- chronic problem, on Fish Oil daily.  No regular exercise.  Obesity- minimal exercise, 'I need to do more'.     Review of Systems For ROS see HPI   This visit occurred during the SARS-CoV-2 public health emergency.  Safety protocols were in place, including screening questions prior to the visit, additional usage of staff PPE, and extensive cleaning of exam room while observing appropriate contact time as indicated for disinfecting solutions.       Objective:   Physical Exam Vitals reviewed.  Constitutional:      General: She is not in acute distress.    Appearance: She is well-developed. She is obese.  HENT:     Head: Normocephalic and atraumatic.  Eyes:     Conjunctiva/sclera: Conjunctivae normal.     Pupils: Pupils are equal, round, and reactive to light.  Neck:     Thyroid: No thyromegaly.  Cardiovascular:     Rate and Rhythm: Normal rate and regular rhythm.     Heart sounds: Normal heart sounds. No murmur.  Pulmonary:     Effort: Pulmonary effort is normal. No respiratory distress.     Breath sounds: Normal breath sounds.  Abdominal:     General: There is no distension.     Palpations: Abdomen is soft.     Tenderness: There is no abdominal tenderness.  Musculoskeletal:     Cervical back: Normal range of motion and neck supple.  Lymphadenopathy:     Cervical: No cervical adenopathy.  Skin:    General: Skin is warm and dry.  Neurological:     Mental Status: She is alert and oriented to person, place, and time.  Psychiatric:        Behavior: Behavior normal.            Assessment & Plan:

## 2019-05-05 NOTE — Assessment & Plan Note (Signed)
Chronic problem.  UTD on foot exam, eye exam, microalbumin.  Stressed need for healthy diet and regular exercise.  Check labs and adjust tx prn.  Will follow.

## 2019-05-05 NOTE — Assessment & Plan Note (Signed)
Ongoing issue.  Down 2 lbs since last visit.  Encouraged more regular exercise and healthy diet.  Will follow.

## 2019-05-05 NOTE — Assessment & Plan Note (Signed)
Chronic problem.  Pt on Fish Oil daily.  Encouraged healthy diet and regular exercise.  Check labs.  Adjust meds prn

## 2019-05-06 ENCOUNTER — Other Ambulatory Visit: Payer: Self-pay | Admitting: General Practice

## 2019-05-06 MED ORDER — METFORMIN HCL 500 MG PO TABS: 500.0000 mg | ORAL_TABLET | Freq: Two times a day (BID) | ORAL | 1 refills | Status: DC

## 2019-07-01 ENCOUNTER — Ambulatory Visit (INDEPENDENT_AMBULATORY_CARE_PROVIDER_SITE_OTHER): Payer: Medicare HMO

## 2019-07-01 ENCOUNTER — Other Ambulatory Visit: Payer: Self-pay

## 2019-07-01 VITALS — BP 126/72 | HR 64 | Temp 98.6°F | Ht 63.0 in | Wt 189.2 lb

## 2019-07-01 DIAGNOSIS — Z0000 Encounter for general adult medical examination without abnormal findings: Secondary | ICD-10-CM

## 2019-07-01 NOTE — Progress Notes (Signed)
Subjective:   Sandra Bailey is a 74 y.o. female who presents for Medicare Annual (Subsequent) preventive examination.  Review of Systems:   Cardiac Risk Factors include: advanced age (>52men, >21 women);diabetes mellitus;obesity (BMI >30kg/m2)    Objective:     Vitals: BP 126/72   Pulse 64   Temp 98.6 F (37 C) (Temporal)   Ht 5\' 3"  (1.6 m)   Wt 189 lb 3.2 oz (85.8 kg)   SpO2 96%   BMI 33.52 kg/m   Body mass index is 33.52 kg/m.  Advanced Directives 07/01/2019 07/03/2017 06/28/2016  Does Patient Have a Medical Advance Directive? No No No  Would patient like information on creating a medical advance directive? Yes (MAU/Ambulatory/Procedural Areas - Information given) Yes (MAU/Ambulatory/Procedural Areas - Information given) Yes (MAU/Ambulatory/Procedural Areas - Information given)    Tobacco Social History   Tobacco Use  Smoking Status Never Smoker  Smokeless Tobacco Never Used     Counseling given: Not Answered   Clinical Intake:  Pre-visit preparation completed: Yes  Pain : No/denies pain  Diabetes: Yes CBG done?: No Did pt. bring in CBG monitor from home?: No  How often do you need to have someone help you when you read instructions, pamphlets, or other written materials from your doctor or pharmacy?: 1 - Never  Interpreter Needed?: No  Information entered by :: Denman George LPN  Past Medical History:  Diagnosis Date  . Diabetes mellitus without complication (HCC)    diet controlled  . Heart murmur   . Kidney stones   . Macular degeneration   . UTI (lower urinary tract infection)    Past Surgical History:  Procedure Laterality Date  . ABDOMINAL HYSTERECTOMY    . LITHOTRIPSY     Family History  Problem Relation Age of Onset  . Macular degeneration Mother   . Hypertension Father   . Glaucoma Sister   . Hypertension Sister   . Heart disease Brother   . Panic disorder Son    Social History   Socioeconomic History  . Marital status:  Married    Spouse name: Not on file  . Number of children: 2  . Years of education: Not on file  . Highest education level: Not on file  Occupational History  . Occupation: Retired     Comment: data entry   Tobacco Use  . Smoking status: Never Smoker  . Smokeless tobacco: Never Used  Substance and Sexual Activity  . Alcohol use: Yes  . Drug use: No  . Sexual activity: Not Currently  Other Topics Concern  . Not on file  Social History Narrative   2 Sons    4 Grandchildren (girls)    Hobbies- Environmental education officer, spending time with cousin    Social Determinants of Radio broadcast assistant Strain:   . Difficulty of Paying Living Expenses:   Food Insecurity:   . Worried About Charity fundraiser in the Last Year:   . Arboriculturist in the Last Year:   Transportation Needs:   . Film/video editor (Medical):   Marland Kitchen Lack of Transportation (Non-Medical):   Physical Activity:   . Days of Exercise per Week:   . Minutes of Exercise per Session:   Stress:   . Feeling of Stress :   Social Connections:   . Frequency of Communication with Friends and Family:   . Frequency of Social Gatherings with Friends and Family:   . Attends Religious Services:   . Active  Member of Clubs or Organizations:   . Attends Archivist Meetings:   Marland Kitchen Marital Status:     Outpatient Encounter Medications as of 07/01/2019  Medication Sig  . calcium carbonate (OSCAL) 1500 (600 Ca) MG TABS tablet Take by mouth 2 (two) times daily with a meal.  . cetirizine (ZYRTEC) 10 MG tablet Take 1 tablet (10 mg total) by mouth daily.  . ciclopirox (PENLAC) 8 % solution Apply topically at bedtime. Apply over nail and surrounding skin. Apply daily over previous coat. After seven (7) days, may remove with alcohol and continue cycle.  . Cranberry 500 MG CAPS Take by mouth.  . metFORMIN (GLUCOPHAGE) 500 MG tablet Take 1 tablet (500 mg total) by mouth 2 (two) times daily with a meal.  . Multiple Vitamins-Minerals  (CENTRUM SILVER 50+WOMEN) TABS Take by mouth.  . Multiple Vitamins-Minerals (PRESERVISION AREDS 2) CAPS Take 2 capsules by mouth daily.   Marland Kitchen nystatin cream (MYCOSTATIN) Apply 1 application topically 2 (two) times daily.  . Omega-3 Fatty Acids (FISH OIL) 1000 MG CAPS Take by mouth.  . Ophthalmic Irrigation Solution (EYE [place] OP) Apply to eye.  . Triamcinolone Acetonide (NASACORT ALLERGY 24HR NA) Place into the nose.  . vitamin E 400 UNIT capsule Take 400 Units by mouth daily.   No facility-administered encounter medications on file as of 07/01/2019.    Activities of Daily Living In your present state of health, do you have any difficulty performing the following activities: 07/01/2019 05/05/2019  Hearing? N N  Vision? Y Y  Difficulty concentrating or making decisions? N N  Walking or climbing stairs? N N  Dressing or bathing? N N  Doing errands, shopping? N N  Preparing Food and eating ? N N  Using the Toilet? N N  In the past six months, have you accidently leaked urine? N N  Do you have problems with loss of bowel control? N N  Managing your Medications? N N  Managing your Finances? N N  Housekeeping or managing your Housekeeping? N N  Some recent data might be hidden    Patient Care Team: Midge Minium, MD as PCP - General (Family Medicine) Faylene Million, MD as Referring Physician (Internal Medicine) Harle Stanford, MD as Referring Physician (Urology) Victory Dakin, MD as Referring Physician (Optometry) Garwin Brothers, Luretha Rued, MD as Referring Physician (Ophthalmology) Sandford Craze, MD as Referring Physician (Dermatology)    Assessment:   This is a routine wellness examination for Sandra Bailey.  Exercise Activities and Dietary recommendations Current Exercise Habits: The patient does not participate in regular exercise at present  Goals    . Weight (lb) < 175 lb (79.4 kg)     Lose weight by increasing exercise.     . Weight (lb) < 180 lb (81.6 kg)     Lose weight by  increasing activity.        Fall Risk Fall Risk  07/01/2019 05/05/2019 05/05/2019 04/14/2019 10/29/2018  Falls in the past year? 0 0 0 0 0  Number falls in past yr: 0 0 0 0 0  Injury with Fall? 0 0 0 0 0  Follow up Falls evaluation completed;Education provided;Falls prevention discussed Falls evaluation completed Falls evaluation completed Falls evaluation completed -   Is the patient's home free of loose throw rugs in walkways, pet beds, electrical cords, etc?   yes      Grab bars in the bathroom? yes      Handrails on the stairs?   yes  Adequate lighting?   yes  Timed Get Up and Go performed: completed and within normal timeframe; no gait abnormalities noted   Depression Screen PHQ 2/9 Scores 07/01/2019 05/05/2019 04/14/2019 10/29/2018  PHQ - 2 Score 0 0 0 0  PHQ- 9 Score - 0 0 0     Cognitive Function- no cognitive concerns at this time  MMSE - Mini Mental State Exam 07/03/2017  Orientation to time 5  Orientation to Place 5  Registration 3  Attention/ Calculation 3  Recall 3  Language- name 2 objects 2  Language- repeat 1  Language- follow 3 step command 3  Language- read & follow direction 1  Write a sentence 1  Copy design 1  Total score 28     6CIT Screen 07/01/2019  What Year? 0 points  What month? 0 points  What time? 0 points  Count back from 20 0 points  Months in reverse 0 points  Repeat phrase 0 points  Total Score 0    Immunization History  Administered Date(s) Administered  . Moderna SARS-COVID-2 Vaccination 05/17/2019, 06/17/2019  . Pneumococcal Conjugate-13 11/30/2015  . Pneumococcal Polysaccharide-23 07/03/2017    Qualifies for Shingles Vaccine?Discussed and patient will check with pharmacy for coverage.  Patient education handout provided   Screening Tests Health Maintenance  Topic Date Due  . TETANUS/TDAP  10/29/2019 (Originally 11/03/1964)  . Hepatitis C Screening  10/29/2019 (Originally 14-Dec-1945)  . INFLUENZA VACCINE  10/04/2019  .  OPHTHALMOLOGY EXAM  10/22/2019  . FOOT EXAM  10/29/2019  . URINE MICROALBUMIN  10/29/2019  . HEMOGLOBIN A1C  11/05/2019  . DEXA SCAN  01/25/2020  . MAMMOGRAM  03/17/2020  . Fecal DNA (Cologuard)  11/18/2021  . COVID-19 Vaccine  Completed  . PNA vac Low Risk Adult  Completed    Cancer Screenings: Lung: Low Dose CT Chest recommended if Age 66-80 years, 30 pack-year currently smoking OR have quit w/in 15years. Patient does not qualify. Breast:  Up to date on Mammogram? Yes   Up to date of Bone Density/Dexa? Yes Colorectal: Cologuard normal 11/26/18     Plan:  I have personally reviewed and addressed the Medicare Annual Wellness questionnaire and have noted the following in the patient's chart:  A. Medical and social history B. Use of alcohol, tobacco or illicit drugs  C. Current medications and supplements D. Functional ability and status E.  Nutritional status F.  Physical activity G. Advance directives H. List of other physicians I.  Hospitalizations, surgeries, and ER visits in previous 12 months J.  [place] such as hearing and vision if needed, cognitive and depression L. Referrals, records requested, and appointments- none   In addition, I have reviewed and discussed with patient certain preventive protocols, quality metrics, and best practice recommendations. A written personalized care plan for preventive services as well as general preventive health recommendations were provided to patient.   Signed,  Denman George, LPN  Nurse Health Advisor   Nurse Notes: Discussed with patient in detail diabetic diet plans and restricting carbs.

## 2019-07-01 NOTE — Patient Instructions (Addendum)
[name] , Thank you for taking time to come for your Medicare Wellness Visit. I appreciate your ongoing commitment to your health goals. Please review the following plan we discussed and let me know if I can assist you in the future.   Screening recommendations/referrals: Colorectal Screening: up to date; Cologuard normal 11/26/18 Mammogram: up to date; last 03/18/19 Bone Density: up to date; last 01/24/18   Vision and Dental Exams: Recommended annual ophthalmology exams for early detection of glaucoma and other disorders of the eye Recommended annual dental exams for proper oral hygiene  Diabetic Exams: Diabetic Eye Exam: recommended yearly; up to date  Diabetic Foot Exam: recommended yearly; up to date   Vaccinations: Influenza vaccine: recommended yearly  Pneumococcal vaccine: up to date; last 07/03/17 Tdap vaccine: recommended every 10 years; Please call your insurance company to determine your out of pocket expense. You also receive this vaccine at your local pharmacy or Health Dept. Shingles vaccine:  You may receive this vaccine at your local pharmacy. (see handout)  Covid vaccine: Completed   Advanced directives: Please bring a copy of your POA (Power of Attorney) and/or Living Will to your next appointment.  Goals: Recommend to drink at least 6-8 8oz glasses of water per day and consume a balanced diet rich in fresh fruits and vegetables.   Next appointment: Please schedule your Annual Wellness Visit with your Nurse Health Advisor in one year.  Preventive Care 75 Years and Older, Female Preventive care refers to lifestyle choices and visits with your health care provider that can promote health and wellness. What does preventive care include?  A yearly physical exam. This is also called an annual well check.  Dental exams once or twice a year.  Routine eye exams. Ask your health care provider how often you should have your eyes checked.  Personal lifestyle choices,  including:  Daily care of your teeth and gums.  Regular physical activity.  Eating a healthy diet.  Avoiding tobacco and drug use.  Limiting alcohol use.  Practicing safe sex.  Taking low-dose aspirin every day if recommended by your health care provider.  Taking vitamin and mineral supplements as recommended by your health care provider. What happens during an annual well check? The services and screenings done by your health care provider during your annual well check will depend on your age, overall health, lifestyle risk factors, and family history of disease. Counseling  Your health care provider may ask you questions about your:  Alcohol use.  Tobacco use.  Drug use.  Emotional well-being.  Home and relationship well-being.  Sexual activity.  Eating habits.  History of falls.  Memory and ability to understand (cognition).  Work and work Statistician.  Reproductive health. Screening  You may have the following tests or measurements:  Height, weight, and BMI.  Blood pressure.  Lipid and cholesterol levels. These may be checked every 5 years, or more frequently if you are over 34 years old.  Skin check.  Lung cancer screening. You may have this screening every year starting at age 80 if you have a 30-pack-year history of smoking and currently smoke or have quit within the past 15 years.  Fecal occult blood test (FOBT) of the stool. You may have this test every year starting at age 75.  Flexible sigmoidoscopy or colonoscopy. You may have a sigmoidoscopy every 5 years or a colonoscopy every 10 years starting at age 75.  Hepatitis C blood test.  Hepatitis B blood test.  Sexually  transmitted disease (STD) testing.  Diabetes screening. This is done by checking your blood sugar (glucose) after you have not eaten for a while (fasting). You may have this done every 1-3 years.  Bone density scan. This is done to screen for osteoporosis. You may have this  done starting at age 22.  Mammogram. This may be done every 1-2 years. Talk to your health care provider about how often you should have regular mammograms. Talk with your health care provider about your test results, treatment options, and if necessary, the need for more tests. Vaccines  Your health care provider may recommend certain vaccines, such as:  Influenza vaccine. This is recommended every year.  Tetanus, diphtheria, and acellular pertussis (Tdap, Td) vaccine. You may need a Td booster every 10 years.  Zoster vaccine. You may need this after age 25.  Pneumococcal 13-valent conjugate (PCV13) vaccine. One dose is recommended after age 75.  Pneumococcal polysaccharide (PPSV23) vaccine. One dose is recommended after age 75. Talk to your health care provider about which screenings and vaccines you need and how often you need them. This information is not intended to replace advice given to you by your health care provider. Make sure you discuss any questions you have with your health care provider. Document Released: 03/18/2015 Document Revised: 11/09/2015 Document Reviewed: 12/21/2014 Elsevier Interactive Patient Education  2017 [employer] Prevention in the Home Falls can cause injuries. They can happen to people of all ages. There are many things you can do to make your home safe and to help prevent falls. What can I do on the outside of my home?  Regularly fix the edges of walkways and driveways and fix any cracks.  Remove anything that might make you trip as you walk through a door, such as a raised step or threshold.  Trim any bushes or trees on the path to your home.  Use bright outdoor lighting.  Clear any walking paths of anything that might make someone trip, such as rocks or tools.  Regularly check to see if handrails are loose or broken. Make sure that both sides of any steps have handrails.  Any raised decks and porches should have guardrails on the  edges.  Have any leaves, snow, or ice cleared regularly.  Use sand or salt on walking paths during winter.  Clean up any spills in your garage right away. This includes oil or grease spills. What can I do in the bathroom?  Use night lights.  Install grab bars by the toilet and in the tub and shower. Do not use towel bars as grab bars.  Use non-skid mats or decals in the tub or shower.  If you need to sit down in the shower, use a plastic, non-slip stool.  Keep the floor dry. Clean up any water that spills on the floor as soon as it happens.  Remove soap buildup in the tub or shower regularly.  Attach bath mats securely with double-sided non-slip rug tape.  Do not have throw rugs and other things on the floor that can make you trip. What can I do in the bedroom?  Use night lights.  Make sure that you have a light by your bed that is easy to reach.  Do not use any sheets or blankets that are too big for your bed. They should not hang down onto the floor.  Have a firm chair that has side arms. You can use this for support while you get dressed.  Do not have throw rugs and other things on the floor that can make you trip. What can I do in the kitchen?  Clean up any spills right away.  Avoid walking on wet floors.  Keep items that you use a lot in easy-to-reach places.  If you need to reach something above you, use a strong step stool that has a grab bar.  Keep electrical cords out of the way.  Do not use floor polish or wax that makes floors slippery. If you must use wax, use non-skid floor wax.  Do not have throw rugs and other things on the floor that can make you trip. What can I do with my stairs?  Do not leave any items on the stairs.  Make sure that there are handrails on both sides of the stairs and use them. Fix handrails that are broken or loose. Make sure that handrails are as long as the stairways.  Check any carpeting to make sure that it is firmly  attached to the stairs. Fix any carpet that is loose or worn.  Avoid having throw rugs at the top or bottom of the stairs. If you do have throw rugs, attach them to the floor with carpet tape.  Make sure that you have a light switch at the top of the stairs and the bottom of the stairs. If you do not have them, ask someone to add them for you. What else can I do to help prevent falls?  Wear shoes that:  Do not have high heels.  Have rubber bottoms.  Are comfortable and fit you well.  Are closed at the toe. Do not wear sandals.  If you use a stepladder:  Make sure that it is fully opened. Do not climb a closed stepladder.  Make sure that both sides of the stepladder are locked into place.  Ask someone to hold it for you, if possible.  Clearly mark and make sure that you can see:  Any grab bars or handrails.  First and last steps.  Where the edge of each step is.  Use tools that help you move around (mobility aids) if they are needed. These include:  Canes.  Walkers.  Scooters.  Crutches.  Turn on the lights when you go into a dark area. Replace any light bulbs as soon as they burn out.  Set up your furniture so you have a clear path. Avoid moving your furniture around.  If any of your floors are uneven, fix them.  If there are any pets around you, be aware of where they are.  Review your medicines with your doctor. Some medicines can make you feel dizzy. This can increase your chance of falling. Ask your doctor what other things that you can do to help prevent falls. This information is not intended to replace advice given to you by your health care provider. Make sure you discuss any questions you have with your health care provider. Document Released: 12/16/2008 Document Revised: 07/28/2015 Document Reviewed: 03/26/2014 Elsevier Interactive Patient Education  2017 [employer].

## 2019-10-13 ENCOUNTER — Ambulatory Visit: Payer: Medicare HMO | Admitting: Family Medicine

## 2019-10-19 HISTORY — PX: EYE SURGERY: SHX253

## 2019-10-21 ENCOUNTER — Telehealth: Payer: Self-pay | Admitting: Family Medicine

## 2019-10-21 NOTE — Telephone Encounter (Signed)
Pt called in stating that she is having eye surgery on 10/28/19 she wanted to know if the medication will effect her bw she is having done on 11/02/19.   Please advise

## 2019-10-21 NOTE — Telephone Encounter (Signed)
Called and spoke with pt. She was worried that the fentanyl they are going to give her would affect her blood work. I advised that per PCP it would not. Pt expressed an understanding.

## 2019-10-21 NOTE — Telephone Encounter (Signed)
Please advise 

## 2019-10-21 NOTE — Telephone Encounter (Signed)
What medication is pt referring to?  I don't have record of any new medications and I have not seen her since March.  If she means the medication given during surgery, it will not impact her blood work after the fact

## 2019-10-28 HISTORY — PX: EYE SURGERY: SHX253

## 2019-11-02 ENCOUNTER — Ambulatory Visit (INDEPENDENT_AMBULATORY_CARE_PROVIDER_SITE_OTHER): Payer: Medicare HMO | Admitting: Family Medicine

## 2019-11-02 ENCOUNTER — Other Ambulatory Visit: Payer: Self-pay

## 2019-11-02 ENCOUNTER — Encounter: Payer: Self-pay | Admitting: Family Medicine

## 2019-11-02 VITALS — BP 120/81 | HR 76 | Temp 97.6°F | Resp 16 | Ht 63.0 in | Wt 182.4 lb

## 2019-11-02 DIAGNOSIS — E119 Type 2 diabetes mellitus without complications: Secondary | ICD-10-CM

## 2019-11-02 DIAGNOSIS — Z0000 Encounter for general adult medical examination without abnormal findings: Secondary | ICD-10-CM

## 2019-11-02 DIAGNOSIS — M810 Age-related osteoporosis without current pathological fracture: Secondary | ICD-10-CM | POA: Diagnosis not present

## 2019-11-02 LAB — CBC WITH DIFFERENTIAL/PLATELET
Basophils Absolute: 0.1 10*3/uL (ref 0.0–0.1)
Basophils Relative: 1.1 % (ref 0.0–3.0)
Eosinophils Absolute: 0.4 10*3/uL (ref 0.0–0.7)
Eosinophils Relative: 5.5 % — ABNORMAL HIGH (ref 0.0–5.0)
HCT: 45 % (ref 36.0–46.0)
Hemoglobin: 15.4 g/dL — ABNORMAL HIGH (ref 12.0–15.0)
Lymphocytes Relative: 34.4 % (ref 12.0–46.0)
Lymphs Abs: 2.6 10*3/uL (ref 0.7–4.0)
MCHC: 34.2 g/dL (ref 30.0–36.0)
MCV: 93.8 fl (ref 78.0–100.0)
Monocytes Absolute: 0.7 10*3/uL (ref 0.1–1.0)
Monocytes Relative: 9 % (ref 3.0–12.0)
Neutro Abs: 3.8 10*3/uL (ref 1.4–7.7)
Neutrophils Relative %: 50 % (ref 43.0–77.0)
Platelets: 279 10*3/uL (ref 150.0–400.0)
RBC: 4.8 Mil/uL (ref 3.87–5.11)
RDW: 12.8 % (ref 11.5–15.5)
WBC: 7.7 10*3/uL (ref 4.0–10.5)

## 2019-11-02 LAB — LIPID PANEL
Cholesterol: 146 mg/dL (ref 0–200)
HDL: 57.1 mg/dL
NonHDL: 89.25
Total CHOL/HDL Ratio: 3
Triglycerides: 231 mg/dL — ABNORMAL HIGH (ref 0.0–149.0)
VLDL: 46.2 mg/dL — ABNORMAL HIGH (ref 0.0–40.0)

## 2019-11-02 LAB — BASIC METABOLIC PANEL
BUN: 16 mg/dL (ref 6–23)
CO2: 28 mEq/L (ref 19–32)
Calcium: 9.7 mg/dL (ref 8.4–10.5)
Chloride: 100 mEq/L (ref 96–112)
Creatinine, Ser: 0.83 mg/dL (ref 0.40–1.20)
GFR: 67.02 mL/min (ref >60.00)
Glucose, Bld: 126 mg/dL — ABNORMAL HIGH (ref 70–99)
Potassium: 4.5 mEq/L (ref 3.5–5.1)
Sodium: 139 mEq/L (ref 135–145)

## 2019-11-02 LAB — LDL CHOLESTEROL, DIRECT: Direct LDL: 79 mg/dL

## 2019-11-02 LAB — HEPATIC FUNCTION PANEL
ALT: 25 U/L (ref 0–35)
AST: 16 U/L (ref 0–37)
Albumin: 4.3 g/dL (ref 3.5–5.2)
Alkaline Phosphatase: 48 U/L (ref 39–117)
Bilirubin, Direct: 0.2 mg/dL (ref 0.0–0.3)
Total Bilirubin: 1.1 mg/dL (ref 0.2–1.2)
Total Protein: 7.1 g/dL (ref 6.0–8.3)

## 2019-11-02 LAB — TSH: TSH: 5.6 u[IU]/mL — ABNORMAL HIGH (ref 0.35–4.50)

## 2019-11-02 LAB — HEMOGLOBIN A1C: Hgb A1c MFr Bld: 6.8 % — ABNORMAL HIGH (ref 4.6–6.5)

## 2019-11-02 LAB — MICROALBUMIN / CREATININE URINE RATIO
Creatinine,U: 147.7 mg/dL
Microalb Creat Ratio: 1.1 mg/g (ref 0.0–30.0)
Microalb, Ur: 1.6 mg/dL (ref 0.0–1.9)

## 2019-11-02 LAB — VITAMIN D 25 HYDROXY (VIT D DEFICIENCY, FRACTURES): VITD: 49.4 ng/mL (ref 30.00–100.00)

## 2019-11-02 NOTE — Progress Notes (Signed)
Subjective:    Patient ID: Sandra Bailey, female    DOB: [birth year], 74 y.o.   MRN: [id number]  HPI CPE- UTD on mammo, cologuard, pneumonia vaccines, COVID vaccines.  Declines flu.  UTD on eye exam, due for foot exam, microalbumin.  Pt is down 7 lbs since last visit.  Reviewed past medical, surgical, family and social histories.   Health Maintenance  Topic Date Due  . FOOT EXAM  10/29/2019  . URINE MICROALBUMIN  10/29/2019  . INFLUENZA VACCINE  06/02/2020 (Originally 10/04/2019)  . TETANUS/TDAP  11/01/2020 (Originally 11/03/1963)  . Hepatitis C Screening  11/01/2020 (Originally January 01, 1946)  . HEMOGLOBIN A1C  11/05/2019  . DEXA SCAN  01/25/2020  . MAMMOGRAM  03/17/2020  . OPHTHALMOLOGY EXAM  05/02/2020  . Fecal DNA (Cologuard)  11/18/2021  . COVID-19 Vaccine  Completed  . PNA vac Low Risk Adult  Completed    Patient Care Team    Relationship Specialty Notifications Start End  Midge Minium, MD PCP - General Family Medicine  08/19/15   Faylene Million, MD Referring Physician Internal Medicine  08/19/15    Comment: cardiology  Harle Stanford, MD Referring Physician Urology  08/19/15   Victory Dakin, MD Referring Physician Optometry  08/19/15   Jasmine Awe, MD Referring Physician Ophthalmology  11/30/15   Sandford Craze, MD Referring Physician Dermatology  06/28/16       Review of Systems Patient reports no hearing changes, adenopathy,fever, persistant/recurrent hoarseness , swallowing issues, chest pain, palpitations, edema, persistant/recurrent cough, hemoptysis, dyspnea (rest/exertional/paroxysmal nocturnal), gastrointestinal bleeding (melena, rectal bleeding), abdominal pain, significant heartburn, bowel changes, GU symptoms (dysuria, hematuria, incontinence), Gyn symptoms (abnormal  bleeding, pain),  syncope, focal weakness, memory loss, numbness & tingling, skin/hair/nail changes, abnormal bruising or bleeding, anxiety, or depression.   + vision issues in L eye  since surgery  This visit occurred during the SARS-CoV-2 public health emergency.  Safety protocols were in place, including screening questions prior to the visit, additional usage of staff PPE, and extensive cleaning of exam room while observing appropriate contact time as indicated for disinfecting solutions.       Objective:   Physical Exam General Appearance:    Alert, cooperative, no distress, appears stated age  Head:    Normocephalic, without obvious abnormality, atraumatic  Eyes:    PERRL, conjunctiva/corneas clear, EOM's intact both eyes  Ears:    Normal TM's and external ear canals, both ears  Nose:   Deferred due to COVID  Throat:   Neck:   Supple, symmetrical, trachea midline, no adenopathy;    Thyroid: no enlargement/tenderness/nodules  Back:     Symmetric, no curvature, ROM normal, no CVA tenderness  Lungs:     Clear to auscultation bilaterally, respirations unlabored  Chest Wall:    No tenderness or deformity   Heart:    Regular rate and rhythm, S1 and S2 normal, no murmur, rub   or gallop  Breast Exam:    Deferred to mammo  Abdomen:     Soft, non-tender, bowel sounds active all four quadrants,    no masses, no organomegaly  Genitalia:    Deferred  Rectal:    Extremities:   Extremities normal, atraumatic, no cyanosis or edema  Pulses:   2+ and symmetric all extremities  Skin:   Skin color, texture, turgor normal, no rashes or lesions  Lymph nodes:   Cervical, supraclavicular, and axillary nodes normal  Neurologic:   CNII-XII intact, normal strength, sensation and reflexes  throughout         Assessment & Plan:

## 2019-11-02 NOTE — Patient Instructions (Signed)
Follow up in 3-4 months to recheck diabetes We'll notify you of your lab results and make any changes if needed Continue to work on healthy diet and regular exercise- you can do it! Call with any questions or concerns Stay Safe!  Stay Healthy! [place]!!

## 2019-11-02 NOTE — Assessment & Plan Note (Signed)
Pt was started on Metformin at last visit due to poorly controlled sugars.  UTD on eye exam.  Foot exam done today.  Will get microalbumin.  Check labs.  Adjust meds prn

## 2019-11-02 NOTE — Assessment & Plan Note (Signed)
Check Vit D and replete prn. 

## 2019-11-02 NOTE — Assessment & Plan Note (Signed)
Pt's PE WNL w/ exception of obesity.  UTD on mammo, cologuard, pneumonia vaccines and COVID vaccines.  Check labs.  Anticipatory guidance provided.

## 2019-11-03 ENCOUNTER — Other Ambulatory Visit (INDEPENDENT_AMBULATORY_CARE_PROVIDER_SITE_OTHER): Payer: Medicare HMO

## 2019-11-03 ENCOUNTER — Other Ambulatory Visit: Payer: Self-pay | Admitting: General Practice

## 2019-11-03 DIAGNOSIS — R7989 Other specified abnormal findings of blood chemistry: Secondary | ICD-10-CM

## 2019-11-03 LAB — T4, FREE: Free T4: 0.97 ng/dL (ref 0.60–1.60)

## 2019-11-03 LAB — T3, FREE: T3, Free: 3.6 pg/mL (ref 2.3–4.2)

## 2019-11-03 MED ORDER — TERBINAFINE HCL 250 MG PO TABS: 250.0000 mg | ORAL_TABLET | Freq: Every day | ORAL | 0 refills | Status: DC

## 2019-11-05 ENCOUNTER — Other Ambulatory Visit: Payer: Self-pay | Admitting: Family Medicine

## 2019-12-23 ENCOUNTER — Telehealth: Payer: Self-pay | Admitting: Family Medicine

## 2019-12-23 NOTE — Telephone Encounter (Signed)
Pt called in asking if Dr. Birdie Riddle could send in the Nystatin cream to [facility]

## 2019-12-23 NOTE — Telephone Encounter (Signed)
Medication is not on patient's current medication list. Please advise.

## 2019-12-24 ENCOUNTER — Other Ambulatory Visit: Payer: Self-pay | Admitting: Family Medicine

## 2019-12-24 MED ORDER — NYSTATIN 100000 UNIT/GM EX CREA
1.0000 | TOPICAL_CREAM | Freq: Two times a day (BID) | CUTANEOUS | 0 refills | Status: DC
Start: 2019-12-24 — End: 2020-02-02

## 2019-12-24 NOTE — Telephone Encounter (Signed)
Prescription sent at pt's request.  If situation is not improving, will need appt to assess/discuss

## 2019-12-24 NOTE — Progress Notes (Signed)
Prescription filled at pt's request ?

## 2019-12-24 NOTE — Telephone Encounter (Signed)
Called patient to inform. Patient is aware.

## 2020-02-02 ENCOUNTER — Encounter: Payer: Self-pay | Admitting: Family Medicine

## 2020-02-02 ENCOUNTER — Ambulatory Visit (INDEPENDENT_AMBULATORY_CARE_PROVIDER_SITE_OTHER): Payer: Medicare HMO | Admitting: Family Medicine

## 2020-02-02 ENCOUNTER — Other Ambulatory Visit: Payer: Self-pay

## 2020-02-02 VITALS — BP 130/80 | HR 66 | Temp 97.5°F | Resp 17 | Ht 63.0 in | Wt 186.4 lb

## 2020-02-02 DIAGNOSIS — E119 Type 2 diabetes mellitus without complications: Secondary | ICD-10-CM

## 2020-02-02 DIAGNOSIS — E669 Obesity, unspecified: Secondary | ICD-10-CM

## 2020-02-02 LAB — BASIC METABOLIC PANEL
BUN: 11 mg/dL (ref 6–23)
CO2: 30 mEq/L (ref 19–32)
Calcium: 9.7 mg/dL (ref 8.4–10.5)
Chloride: 102 mEq/L (ref 96–112)
Creatinine, Ser: 0.77 mg/dL (ref 0.40–1.20)
GFR: 75.55 mL/min (ref >60.00)
Glucose, Bld: 133 mg/dL — ABNORMAL HIGH (ref 70–99)
Potassium: 4.6 mEq/L (ref 3.5–5.1)
Sodium: 139 mEq/L (ref 135–145)

## 2020-02-02 LAB — HEMOGLOBIN A1C: Hgb A1c MFr Bld: 6.5 % (ref 4.6–6.5)

## 2020-02-02 NOTE — Assessment & Plan Note (Signed)
Pt has gained 5 lbs since last visit.  Discussed need for healthy diet and regular exercise.  Will continue to follow.

## 2020-02-02 NOTE — Assessment & Plan Note (Signed)
Ongoing issue for pt.  On Metformin 500mg  BID.  UTD on eye exam, foot exam, and microalbumin.  Check labs.  Adjust meds prn

## 2020-02-02 NOTE — Patient Instructions (Addendum)
Follow up in 3-4 months to recheck diabetes, cholesterol, weight loss progress We'll notify you of your lab results and make any changes if needed Continue to work on healthy diet and regular exercise- you can do it! Call with any questions or concerns Stay Safe!  Stay Healthy! Happy Holidays!!

## 2020-02-02 NOTE — Progress Notes (Signed)
   Subjective:    Patient ID: Sandra Bailey, female    DOB: 1946/01/12, 74 y.o.   MRN: [id number]  HPI DM- chronic problem, on Metformin 500mg  BID.  Last A1C 6.8%.  UTD on eye exam, foot exam, and microalbumin.  Pt is up nearly 5 lbs since August.  'not exercising as much as I was'.  No CP, SOB, HAs, abd pain, N/V.  Denies symptomatic lows.  No numbness/tingling   Review of Systems For ROS see HPI   This visit occurred during the SARS-CoV-2 public health emergency.  Safety protocols were in place, including screening questions prior to the visit, additional usage of staff PPE, and extensive cleaning of exam room while observing appropriate contact time as indicated for disinfecting solutions.       Objective:   Physical Exam Vitals reviewed.  Constitutional:      General: She is not in acute distress.    Appearance: Normal appearance. She is well-developed. She is not ill-appearing.  HENT:     Head: Normocephalic and atraumatic.  Eyes:     Conjunctiva/sclera: Conjunctivae normal.     Pupils: Pupils are equal, round, and reactive to light.  Neck:     Thyroid: No thyromegaly.  Cardiovascular:     Rate and Rhythm: Normal rate and regular rhythm.     Heart sounds: Normal heart sounds. No murmur heard.   Pulmonary:     Effort: Pulmonary effort is normal. No respiratory distress.     Breath sounds: Normal breath sounds.  Abdominal:     General: There is no distension.     Palpations: Abdomen is soft.     Tenderness: There is no abdominal tenderness.  Musculoskeletal:     Cervical back: Normal range of motion and neck supple.     Right lower leg: No edema.     Left lower leg: No edema.  Lymphadenopathy:     Cervical: No cervical adenopathy.  Skin:    General: Skin is warm and dry.  Neurological:     Mental Status: She is alert and oriented to person, place, and time.  Psychiatric:        Behavior: Behavior normal.           Assessment & Plan:

## 2020-03-14 ENCOUNTER — Telehealth: Payer: Self-pay | Admitting: Family Medicine

## 2020-03-14 ENCOUNTER — Telehealth: Payer: Self-pay

## 2020-03-14 NOTE — Telephone Encounter (Signed)
See telephone note for documentation.

## 2020-03-14 NOTE — Telephone Encounter (Signed)
Returned pts call about some swelling that she had experienced after taking the booster shot. Pt stated that she did not have any problems with the first 2 shots but did have 7 days worth of swelling and wanted it to be documented. Patient presents no swelling or symptoms to the date. No further concerns at this time.

## 2020-03-14 NOTE — Telephone Encounter (Signed)
Patient called and said that her arm had swollen up after her covid booster and that her face swelled slightly. -   It stayed swollen for 7 days.  Patient states that she had eaten a lot of dry roasted nuts and feels like this could have caused the reaction.

## 2020-04-27 ENCOUNTER — Other Ambulatory Visit: Payer: Self-pay | Admitting: Family Medicine

## 2020-06-02 ENCOUNTER — Encounter: Payer: Self-pay | Admitting: Family Medicine

## 2020-06-02 ENCOUNTER — Ambulatory Visit (INDEPENDENT_AMBULATORY_CARE_PROVIDER_SITE_OTHER): Payer: Medicare HMO | Admitting: Family Medicine

## 2020-06-02 ENCOUNTER — Other Ambulatory Visit: Payer: Self-pay

## 2020-06-02 VITALS — BP 130/90 | HR 67 | Temp 97.9°F | Resp 17 | Ht 63.0 in | Wt 184.4 lb

## 2020-06-02 DIAGNOSIS — E669 Obesity, unspecified: Secondary | ICD-10-CM | POA: Diagnosis not present

## 2020-06-02 DIAGNOSIS — E781 Pure hyperglyceridemia: Secondary | ICD-10-CM | POA: Diagnosis not present

## 2020-06-02 DIAGNOSIS — E119 Type 2 diabetes mellitus without complications: Secondary | ICD-10-CM

## 2020-06-02 LAB — HEMOGLOBIN A1C: Hgb A1c MFr Bld: 7 % — ABNORMAL HIGH (ref 4.6–6.5)

## 2020-06-02 LAB — HEPATIC FUNCTION PANEL
ALT: 28 U/L (ref 0–35)
AST: 24 U/L (ref 0–37)
Albumin: 4.1 g/dL (ref 3.5–5.2)
Alkaline Phosphatase: 46 U/L (ref 39–117)
Bilirubin, Direct: 0.2 mg/dL (ref 0.0–0.3)
Total Bilirubin: 1.2 mg/dL (ref 0.2–1.2)
Total Protein: 7.1 g/dL (ref 6.0–8.3)

## 2020-06-02 LAB — BASIC METABOLIC PANEL
BUN: 13 mg/dL (ref 6–23)
CO2: 28 mEq/L (ref 19–32)
Calcium: 9.8 mg/dL (ref 8.4–10.5)
Chloride: 101 mEq/L (ref 96–112)
Creatinine, Ser: 0.81 mg/dL (ref 0.40–1.20)
GFR: 70.93 mL/min (ref >60.00)
Glucose, Bld: 159 mg/dL — ABNORMAL HIGH (ref 70–99)
Potassium: 4.1 mEq/L (ref 3.5–5.1)
Sodium: 138 mEq/L (ref 135–145)

## 2020-06-02 LAB — CBC WITH DIFFERENTIAL/PLATELET
Basophils Absolute: 0 10*3/uL (ref 0.0–0.1)
Basophils Relative: 0.8 % (ref 0.0–3.0)
Eosinophils Absolute: 0.3 10*3/uL (ref 0.0–0.7)
Eosinophils Relative: 4.8 % (ref 0.0–5.0)
HCT: 42.7 % (ref 36.0–46.0)
Hemoglobin: 14.5 g/dL (ref 12.0–15.0)
Lymphocytes Relative: 33.4 % (ref 12.0–46.0)
Lymphs Abs: 1.9 10*3/uL (ref 0.7–4.0)
MCHC: 33.9 g/dL (ref 30.0–36.0)
MCV: 92.5 fl (ref 78.0–100.0)
Monocytes Absolute: 0.5 10*3/uL (ref 0.1–1.0)
Monocytes Relative: 8.8 % (ref 3.0–12.0)
Neutro Abs: 2.9 10*3/uL (ref 1.4–7.7)
Neutrophils Relative %: 52.2 % (ref 43.0–77.0)
Platelets: 258 10*3/uL (ref 150.0–400.0)
RBC: 4.62 Mil/uL (ref 3.87–5.11)
RDW: 13.6 % (ref 11.5–15.5)
WBC: 5.6 10*3/uL (ref 4.0–10.5)

## 2020-06-02 LAB — LIPID PANEL
Cholesterol: 148 mg/dL (ref 0–200)
HDL: 56.6 mg/dL (ref >39.00)
LDL Cholesterol: 54 mg/dL (ref 0–99)
NonHDL: 90.91
Total CHOL/HDL Ratio: 3
Triglycerides: 187 mg/dL — ABNORMAL HIGH (ref 0.0–149.0)
VLDL: 37.4 mg/dL (ref 0.0–40.0)

## 2020-06-02 LAB — TSH: TSH: 3.13 u[IU]/mL (ref 0.35–4.50)

## 2020-06-02 NOTE — Assessment & Plan Note (Signed)
Chronic problem.  On fish oil daily.  Check labs.  Adjust meds prn

## 2020-06-02 NOTE — Patient Instructions (Signed)
Schedule your complete physical in 6 months We'll notify you of your lab results and make any changes if needed Continue to work on healthy diet and regular exercise- remember to take care of yourself!!! Call with any questions or concerns Stay Safe!  Stay Healthy! [place]!

## 2020-06-02 NOTE — Assessment & Plan Note (Signed)
Chronic problem.  Pt has decreased Metformin from BID to daily due to swelling.  UTD on eye exam, foot exam, microalbumin.  Encouraged healthy, low carb diet and regular exercise.  Check labs.  Adjust meds prn

## 2020-06-02 NOTE — Assessment & Plan Note (Signed)
Ongoing issue.  BMI is 32.66  Discussed need for low carb diet and regular physical activity.  Will continue to follow.
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
## 2020-06-10 ENCOUNTER — Other Ambulatory Visit: Payer: Self-pay | Admitting: Family Medicine

## 2020-06-10 NOTE — Telephone Encounter (Signed)
Pt would like a refill on the Nystatin sent in please advise   Pt is out and in need of this.

## 2020-07-04 ENCOUNTER — Ambulatory Visit (INDEPENDENT_AMBULATORY_CARE_PROVIDER_SITE_OTHER): Payer: Medicare HMO

## 2020-07-04 VITALS — Ht 63.0 in | Wt 184.0 lb

## 2020-07-04 DIAGNOSIS — Z0000 Encounter for general adult medical examination without abnormal findings: Secondary | ICD-10-CM

## 2020-07-04 NOTE — Progress Notes (Signed)
Subjective:   Sandra Bailey is a 74 y.o. female who presents for Medicare Annual (Subsequent) preventive examination.  I connected with Dianna today by telephone and verified that I am speaking with the correct person using two identifiers. Location patient: home Location provider: work Persons participating in the virtual visit: patient, Marine scientist.    I discussed the limitations, risks, security and privacy concerns of performing an evaluation and management service by telephone and the availability of in person appointments. I also discussed with the patient that there may be a patient responsible charge related to this service. The patient expressed understanding and verbally consented to this telephonic visit.    Interactive audio and video telecommunications were attempted between this provider and patient, however failed, due to patient having technical difficulties OR patient did not have access to video capability.  We continued and completed visit with audio only.  Some vital signs may be absent or patient reported.   Time Spent with patient on telephone encounter: 25 minutes   Review of Systems     Cardiac Risk Factors include: advanced age (>8men, >33 women);diabetes mellitus;dyslipidemia;obesity (BMI >30kg/m2)     Objective:    Today's Vitals   07/04/20 1116  Weight: 184 lb (83.5 kg)  Height: 5\' 3"  (1.6 m)   Body mass index is 32.59 kg/m.  Advanced Directives 07/04/2020 07/01/2019 07/03/2017 06/28/2016  Does Patient Have a Medical Advance Directive? No No No No  Would patient like information on creating a medical advance directive? No - Patient declined Yes (MAU/Ambulatory/Procedural Areas - Information given) Yes (MAU/Ambulatory/Procedural Areas - Information given) Yes (MAU/Ambulatory/Procedural Areas - Information given)    Current Medications (verified) Outpatient Encounter Medications as of 07/04/2020  Medication Sig  . Biotin 10000 MCG TBDP Take by mouth.  .  calcium carbonate (OSCAL) 1500 (600 Ca) MG TABS tablet Take by mouth 2 (two) times daily with a meal.  . Cranberry 500 MG CAPS Take by mouth.  . metFORMIN (GLUCOPHAGE) 500 MG tablet TAKE 1 TABLET BY MOUTH TWICE A DAY WITH A MEAL  . Multiple Vitamins-Minerals (CENTRUM SILVER 50+WOMEN) TABS Take by mouth.  . Multiple Vitamins-Minerals (PRESERVISION AREDS 2) CAPS Take 2 capsules by mouth daily.   Marland Kitchen nystatin cream (MYCOSTATIN) APPLY TO AFFECTED AREA TWICE A DAY  . Omega-3 Fatty Acids (FISH OIL) 1000 MG CAPS Take by mouth.  . Triamcinolone Acetonide (NASACORT ALLERGY 24HR NA) Place into the nose.   No facility-administered encounter medications on file as of 07/04/2020.    Allergies (verified) Macrodantin [nitrofurantoin macrocrystal] and Silver sulfadiazine   History: Past Medical History:  Diagnosis Date  . Diabetes mellitus without complication (HCC)    diet controlled  . Heart murmur   . Kidney stones   . Macular degeneration   . UTI (lower urinary tract infection)    Past Surgical History:  Procedure Laterality Date  . ABDOMINAL HYSTERECTOMY    . cataracts    . EYE SURGERY  10/28/2019   catarat surgery left eye  . EYE SURGERY  10/19/2019   catarat surgery right eye  . LITHOTRIPSY     Family History  Problem Relation Age of Onset  . Macular degeneration Mother   . Hypertension Father   . Glaucoma Sister   . Hypertension Sister   . Heart disease Brother   . Panic disorder Son    Social History   Socioeconomic History  . Marital status: Married    Spouse name: Not on file  . Number of children:  2  . Years of education: Not on file  . Highest education level: Not on file  Occupational History  . Occupation: Retired     Comment: data entry   Tobacco Use  . Smoking status: Never Smoker  . Smokeless tobacco: Never Used  Vaping Use  . Vaping Use: Never used  Substance and Sexual Activity  . Alcohol use: Yes  . Drug use: No  . Sexual activity: Not Currently  Other  Topics Concern  . Not on file  Social History Narrative   2 Sons    4 Grandchildren (girls)    Hobbies- Environmental education officer, spending time with cousin    Social Determinants of Radio broadcast assistant Strain: Low Risk   . Difficulty of Paying Living Expenses: Not hard at all  Food Insecurity: No Food Insecurity  . Worried About Charity fundraiser in the Last Year: Never true  . Ran Out of Food in the Last Year: Never true  Transportation Needs: No Transportation Needs  . Lack of Transportation (Medical): No  . Lack of Transportation (Non-Medical): No  Physical Activity: Insufficiently Active  . Days of Exercise per Week: 4 days  . Minutes of Exercise per Session: 30 min  Stress: No Stress Concern Present  . Feeling of Stress : Not at all  Social Connections: Moderately Integrated  . Frequency of Communication with Friends and Family: More than three times a week  . Frequency of Social Gatherings with Friends and Family: More than three times a week  . Attends Religious Services: Never  . Active Member of Clubs or Organizations: Yes  . Attends Archivist Meetings: 1 to 4 times per year  . Marital Status: Married    Tobacco Counseling Counseling given: Not Answered   Clinical Intake:  Pre-visit preparation completed: Yes  Pain : No/denies pain     Nutritional Status: BMI > 30  Obese Nutritional Risks: None Diabetes: Yes CBG done?: No Did pt. bring in CBG monitor from home?: No (never)  How often do you need to have someone help you when you read instructions, pamphlets, or other written materials from your doctor or pharmacy?: 1 - Never  Diabetes:  Is the patient diabetic?  Yes  If diabetic, was a CBG obtained today?  No  Did the patient bring in their glucometer from home?  No phone visit How often do you monitor your CBG's? never.   Financial Strains and Diabetes Management:  Are you having any financial strains with the device, your supplies or your  medication? No .  Does the patient want to be seen by Chronic Care Management for management of their diabetes?  No  Would the patient like to be referred to a Nutritionist or for Diabetic Management?  No   Diabetic Exams:  Diabetic Eye Exam: Completed 04/2020-per patient.   Diabetic Foot Exam: Completed 11/02/2019.     Interpreter Needed?: No  Information entered by :: Caroleen Hamman LPN   Activities of Daily Living In your present state of health, do you have any difficulty performing the following activities: 07/04/2020 06/02/2020  Hearing? N N  Vision? N N  Difficulty concentrating or making decisions? N N  Walking or climbing stairs? N N  Dressing or bathing? N N  Doing errands, shopping? N N  Preparing Food and eating ? N -  Using the Toilet? N -  In the past six months, have you accidently leaked urine? N -  Do you have problems  with loss of bowel control? N -  Managing your Medications? N -  Managing your Finances? N -  Housekeeping or managing your Housekeeping? N -  Some recent data might be hidden    Patient Care Team: Midge Minium, MD as PCP - General (Family Medicine) Faylene Million, MD as Referring Physician (Internal Medicine) Harle Stanford, MD as Referring Physician (Urology) Victory Dakin, MD as Referring Physician (Optometry) Garwin Brothers, Luretha Rued, MD as Referring Physician (Ophthalmology) Sandford Craze, MD as Referring Physician (Dermatology)  Indicate any recent Medical Services you may have received from other than [facility] providers in the past year (date may be approximate).     Assessment:   This is a routine wellness examination for Jillanne.  Hearing/Vision screen  Hearing Screening   125Hz  250Hz  500Hz  1000Hz  2000Hz  3000Hz  4000Hz  6000Hz  8000Hz   Right ear:           Left ear:           Comments: No issues  Vision Screening Comments: Wears glasses Last eye exam-2021-Dr Mettu  Dietary issues and exercise activities  discussed: Current Exercise Habits: Home exercise routine, Type of exercise: walking, Time (Minutes): 30, Frequency (Times/Week): 3, Weekly Exercise (Minutes/Week): 90, Intensity: Mild, Exercise limited by: None identified  Goals Addressed            This Visit's Progress   . Patient Stated       Eat healthier      Depression Screen PHQ 2/9 Scores 07/04/2020 06/02/2020 02/02/2020 11/02/2019 07/01/2019 05/05/2019 04/14/2019  PHQ - 2 Score 0 0 0 0 0 0 0  PHQ- 9 Score - 0 0 0 - 0 0    Fall Risk Fall Risk  07/04/2020 06/02/2020 02/02/2020 11/02/2019 07/01/2019  Falls in the past year? 0 1 0 0 0  Number falls in past yr: 0 0 0 0 0  Injury with Fall? 0 1 0 0 0  Comment - soreness on the right side. stepped in hole - - -  Risk for fall due to : - No Fall Risks No Fall Risks - -  Follow up Falls prevention discussed - - Falls evaluation completed Falls evaluation completed;Education provided;Falls prevention discussed    FALL RISK PREVENTION PERTAINING TO THE HOME:  Any stairs in or around the home? Yes  If so, are there any without handrails? No  Home free of loose throw rugs in walkways, pet beds, electrical cords, etc? Yes  Adequate lighting in your home to reduce risk of falls? Yes   ASSISTIVE DEVICES UTILIZED TO PREVENT FALLS:  Life alert? No  Use of a cane, walker or w/c? No  Grab bars in the bathroom? Yes  Shower chair or bench in shower? No  Elevated toilet seat or a handicapped toilet? No   TIMED UP AND GO:  Was the test performed? No . Phone visit   Cognitive Function:Normal cognitive status assessed by  this Nurse Health Advisor. No abnormalities found.   MMSE - Mini Mental State Exam 07/03/2017  Orientation to time 5  Orientation to Place 5  Registration 3  Attention/ Calculation 3  Recall 3  Language- name 2 objects 2  Language- repeat 1  Language- follow 3 step command 3  Language- read & follow direction 1  Write a sentence 1  Copy design 1  Total score 28      6CIT Screen 07/01/2019  What Year? 0 points  What month? 0 points  What time? 0 points  Count  back from 20 0 points  Months in reverse 0 points  Repeat phrase 0 points  Total Score 0    Immunizations Immunization History  Administered Date(s) Administered  . Moderna SARS-COV2 Booster Vaccination 12/31/2019  . Moderna Sars-Covid-2 Vaccination 05/17/2019, 06/17/2019  . Pneumococcal Conjugate-13 11/30/2015  . Pneumococcal Polysaccharide-23 07/03/2017    TDAP status: Up to date  Flu Vaccine status: Due, Education has been provided regarding the importance of this vaccine. Advised may receive this vaccine at local pharmacy or Health Dept. Aware to provide a copy of the vaccination record if obtained from local pharmacy or Health Dept. Verbalized acceptance and understanding.  Pneumococcal vaccine status: Up to date  Covid-19 vaccine status: Completed vaccines  Qualifies for Shingles Vaccine? Yes   Zostavax completed No   Shingrix Completed?: No.    Education has been provided regarding the importance of this vaccine. Patient has been advised to call insurance company to determine out of pocket expense if they have not yet received this vaccine. Advised may also receive vaccine at local pharmacy or Health Dept. Verbalized acceptance and understanding.  Screening Tests Health Maintenance  Topic Date Due  . OPHTHALMOLOGY EXAM  05/02/2020  . COVID-19 Vaccine (4 - Booster for Moderna series) 06/30/2020  . MAMMOGRAM  10/31/2020 (Originally 03/17/2020)  . TETANUS/TDAP  11/01/2020 (Originally 11/03/1964)  . Hepatitis C Screening  11/01/2020 (Originally 01-08-1946)  . DEXA SCAN  11/07/2020 (Originally 01/25/2020)  . INFLUENZA VACCINE  10/03/2020  . FOOT EXAM  11/01/2020  . URINE MICROALBUMIN  11/01/2020  . HEMOGLOBIN A1C  12/02/2020  . Fecal DNA (Cologuard)  11/18/2021  . PNA vac Low Risk Adult  Completed  . HPV VACCINES  Aged Out    Health Maintenance  Health Maintenance Due   Topic Date Due  . OPHTHALMOLOGY EXAM  05/02/2020  . COVID-19 Vaccine (4 - Booster for Moderna series) 06/30/2020    Colorectal cancer screening: Type of screening: Cologuard. Completed 11/19/2018. Repeat every 3 years  Mammogram status: Due- Declined  Bone Density status: Due-Declined  Lung Cancer Screening: (Low Dose CT Chest recommended if Age 38-80 years, 30 pack-year currently smoking OR have quit w/in 15years.) does not qualif  Additional Screening:  Hepatitis C Screening: does qualify; Declined  Vision Screening: Recommended annual ophthalmology exams for early detection of glaucoma and other disorders of the eye. Is the patient up to date with their annual eye exam?  Yes  Who is the provider or what is the name of the office in which the patient attends annual eye exams? [facility] .   Dental Screening: Recommended annual dental exams for proper oral hygiene  Community Resource Referral / Chronic Care Management: CRR required this visit?  No   CCM required this visit?  No      Plan:     I have personally reviewed and noted the following in the patient's chart:   . Medical and social history . Use of alcohol, tobacco or illicit drugs  . Current medications and supplements including opioid prescriptions.  . Functional ability and status . Nutritional status . Physical activity . Advanced directives . List of other physicians . Hospitalizations, surgeries, and ER visits in previous 12 months . Vitals . Screenings to include cognitive, depression, and falls . Referrals and appointments  In addition, I have reviewed and discussed with patient certain preventive protocols, quality metrics, and best practice recommendations. A written personalized care plan for preventive services as well as general preventive health recommendations were provided to  patient.   Due to this being a telephonic visit, the after visit summary with patients personalized plan was offered to  patient via mail or my-chart. Patient would like to access on my-chart.   Marta Antu, LPN   [id number]  Nurse Health Advisor  Nurse Notes: None

## 2020-07-04 NOTE — Patient Instructions (Signed)
[name] , Thank you for taking time to complete your Medicare Wellness Visit. I appreciate your ongoing commitment to your health goals. Please review the following plan we discussed and let me know if I can assist you in the future.   Screening recommendations/referrals: Colonoscopy: Cologuard completed 11/19/2018-Due-11/18/2021 Mammogram: Due- Declined today. Please call the office to schedule when you are ready to schedule. Bone Density: Due- Declined today. Please call the office to schedule when you are ready to schedule. Recommended yearly ophthalmology/optometry visit for glaucoma screening and checkup Recommended yearly dental visit for hygiene and checkup  Vaccinations: Influenza vaccine: Declined Pneumococcal vaccine: Completed vaccines Tdap vaccine: Discuss with pharmacy Shingles vaccine: Discuss with pharmacy  Covid-19:Up to date  Advanced directives: Declined information  Conditions/risks identified: See problem list  Next appointment: Follow up in one year for your annual wellness visit    Preventive Care 75 Years and Older, Female Preventive care refers to lifestyle choices and visits with your health care provider that can promote health and wellness. What does preventive care include?  A yearly physical exam. This is also called an annual well check.  Dental exams once or twice a year.  Routine eye exams. Ask your health care provider how often you should have your eyes checked.  Personal lifestyle choices, including:  Daily care of your teeth and gums.  Regular physical activity.  Eating a healthy diet.  Avoiding tobacco and drug use.  Limiting alcohol use.  Practicing safe sex.  Taking low-dose aspirin every day.  Taking vitamin and mineral supplements as recommended by your health care provider. What happens during an annual well check? The services and screenings done by your health care provider during your annual well check will depend on  your age, overall health, lifestyle risk factors, and family history of disease. Counseling  Your health care provider may ask you questions about your:  Alcohol use.  Tobacco use.  Drug use.  Emotional well-being.  Home and relationship well-being.  Sexual activity.  Eating habits.  History of falls.  Memory and ability to understand (cognition).  Work and work Statistician.  Reproductive health. Screening  You may have the following tests or measurements:  Height, weight, and BMI.  Blood pressure.  Lipid and cholesterol levels. These may be checked every 5 years, or more frequently if you are over 56 years old.  Skin check.  Lung cancer screening. You may have this screening every year starting at age 52 if you have a 30-pack-year history of smoking and currently smoke or have quit within the past 15 years.  Fecal occult blood test (FOBT) of the stool. You may have this test every year starting at age 49.  Flexible sigmoidoscopy or colonoscopy. You may have a sigmoidoscopy every 5 years or a colonoscopy every 10 years starting at age 38.  Hepatitis C blood test.  Hepatitis B blood test.  Sexually transmitted disease (STD) testing.  Diabetes screening. This is done by checking your blood sugar (glucose) after you have not eaten for a while (fasting). You may have this done every 1-3 years.  Bone density scan. This is done to screen for osteoporosis. You may have this done starting at age 59.  Mammogram. This may be done every 1-2 years. Talk to your health care provider about how often you should have regular mammograms. Talk with your health care provider about your test results, treatment options, and if necessary, the need for more tests. Vaccines  Your health care provider  may recommend certain vaccines, such as:  Influenza vaccine. This is recommended every year.  Tetanus, diphtheria, and acellular pertussis (Tdap, Td) vaccine. You may need a Td booster  every 10 years.  Zoster vaccine. You may need this after age 79.  Pneumococcal 13-valent conjugate (PCV13) vaccine. One dose is recommended after age 64.  Pneumococcal polysaccharide (PPSV23) vaccine. One dose is recommended after age 69. Talk to your health care provider about which screenings and vaccines you need and how often you need them. This information is not intended to replace advice given to you by your health care provider. Make sure you discuss any questions you have with your health care provider. Document Released: 03/18/2015 Document Revised: 11/09/2015 Document Reviewed: 12/21/2014 Elsevier Interactive Patient Education  2017 [place] Prevention in the Home Falls can cause injuries. They can happen to people of all ages. There are many things you can do to make your home safe and to help prevent falls. What can I do on the outside of my home?  Regularly fix the edges of walkways and driveways and fix any cracks.  Remove anything that might make you trip as you walk through a door, such as a raised step or threshold.  Trim any bushes or trees on the path to your home.  Use bright outdoor lighting.  Clear any walking paths of anything that might make someone trip, such as rocks or tools.  Regularly check to see if handrails are loose or broken. Make sure that both sides of any steps have handrails.  Any raised decks and porches should have guardrails on the edges.  Have any leaves, snow, or ice cleared regularly.  Use sand or salt on walking paths during winter.  Clean up any spills in your garage right away. This includes oil or grease spills. What can I do in the bathroom?  Use night lights.  Install grab bars by the toilet and in the tub and shower. Do not use towel bars as grab bars.  Use non-skid mats or decals in the tub or shower.  If you need to sit down in the shower, use a plastic, non-slip stool.  Keep the floor dry. Clean up any  water that spills on the floor as soon as it happens.  Remove soap buildup in the tub or shower regularly.  Attach bath mats securely with double-sided non-slip rug tape.  Do not have throw rugs and other things on the floor that can make you trip. What can I do in the bedroom?  Use night lights.  Make sure that you have a light by your bed that is easy to reach.  Do not use any sheets or blankets that are too big for your bed. They should not hang down onto the floor.  Have a firm chair that has side arms. You can use this for support while you get dressed.  Do not have throw rugs and other things on the floor that can make you trip. What can I do in the kitchen?  Clean up any spills right away.  Avoid walking on wet floors.  Keep items that you use a lot in easy-to-reach places.  If you need to reach something above you, use a strong step stool that has a grab bar.  Keep electrical cords out of the way.  Do not use floor polish or wax that makes floors slippery. If you must use wax, use non-skid floor wax.  Do not have throw rugs  and other things on the floor that can make you trip. What can I do with my stairs?  Do not leave any items on the stairs.  Make sure that there are handrails on both sides of the stairs and use them. Fix handrails that are broken or loose. Make sure that handrails are as long as the stairways.  Check any carpeting to make sure that it is firmly attached to the stairs. Fix any carpet that is loose or worn.  Avoid having throw rugs at the top or bottom of the stairs. If you do have throw rugs, attach them to the floor with carpet tape.  Make sure that you have a light switch at the top of the stairs and the bottom of the stairs. If you do not have them, ask someone to add them for you. What else can I do to help prevent falls?  Wear shoes that:  Do not have high heels.  Have rubber bottoms.  Are comfortable and fit you well.  Are closed  at the toe. Do not wear sandals.  If you use a stepladder:  Make sure that it is fully opened. Do not climb a closed stepladder.  Make sure that both sides of the stepladder are locked into place.  Ask someone to hold it for you, if possible.  Clearly mark and make sure that you can see:  Any grab bars or handrails.  First and last steps.  Where the edge of each step is.  Use tools that help you move around (mobility aids) if they are needed. These include:  Canes.  Walkers.  Scooters.  Crutches.  Turn on the lights when you go into a dark area. Replace any light bulbs as soon as they burn out.  Set up your furniture so you have a clear path. Avoid moving your furniture around.  If any of your floors are uneven, fix them.  If there are any pets around you, be aware of where they are.  Review your medicines with your doctor. Some medicines can make you feel dizzy. This can increase your chance of falling. Ask your doctor what other things that you can do to help prevent falls. This information is not intended to replace advice given to you by your health care provider. Make sure you discuss any questions you have with your health care provider. Document Released: 12/16/2008 Document Revised: 07/28/2015 Document Reviewed: 03/26/2014 Elsevier Interactive Patient Education  2017 [employer].

## 2020-08-31 ENCOUNTER — Encounter: Payer: Self-pay | Admitting: *Deleted

## 2020-11-08 ENCOUNTER — Other Ambulatory Visit: Payer: Self-pay | Admitting: Family Medicine

## 2020-11-29 ENCOUNTER — Other Ambulatory Visit: Payer: Self-pay

## 2020-11-29 ENCOUNTER — Encounter: Payer: Self-pay | Admitting: Family Medicine

## 2020-11-29 ENCOUNTER — Ambulatory Visit (INDEPENDENT_AMBULATORY_CARE_PROVIDER_SITE_OTHER): Payer: Medicare HMO | Admitting: Family Medicine

## 2020-11-29 VITALS — BP 120/84 | HR 68 | Temp 98.2°F | Resp 17 | Ht 63.0 in | Wt 186.8 lb

## 2020-11-29 DIAGNOSIS — E119 Type 2 diabetes mellitus without complications: Secondary | ICD-10-CM

## 2020-11-29 DIAGNOSIS — M810 Age-related osteoporosis without current pathological fracture: Secondary | ICD-10-CM | POA: Diagnosis not present

## 2020-11-29 DIAGNOSIS — Z0000 Encounter for general adult medical examination without abnormal findings: Secondary | ICD-10-CM

## 2020-11-29 LAB — VITAMIN D 25 HYDROXY (VIT D DEFICIENCY, FRACTURES): VITD: 38.51 ng/mL (ref 30.00–100.00)

## 2020-11-29 LAB — LIPID PANEL
Cholesterol: 159 mg/dL (ref 0–200)
HDL: 60.8 mg/dL (ref >39.00)
LDL Cholesterol: 66 mg/dL (ref 0–99)
NonHDL: 98.61
Total CHOL/HDL Ratio: 3
Triglycerides: 165 mg/dL — ABNORMAL HIGH (ref 0.0–149.0)
VLDL: 33 mg/dL (ref 0.0–40.0)

## 2020-11-29 LAB — MICROALBUMIN / CREATININE URINE RATIO
Creatinine,U: 30.4 mg/dL
Microalb Creat Ratio: 2.3 mg/g (ref 0.0–30.0)
Microalb, Ur: <0.7 mg/dL (ref 0.0–1.9)

## 2020-11-29 LAB — CBC WITH DIFFERENTIAL/PLATELET
Basophils Absolute: 0.1 10*3/uL (ref 0.0–0.1)
Basophils Relative: 0.9 % (ref 0.0–3.0)
Eosinophils Absolute: 0.3 10*3/uL (ref 0.0–0.7)
Eosinophils Relative: 4 % (ref 0.0–5.0)
HCT: 43.5 % (ref 36.0–46.0)
Hemoglobin: 14.8 g/dL (ref 12.0–15.0)
Lymphocytes Relative: 33.1 % (ref 12.0–46.0)
Lymphs Abs: 2.4 10*3/uL (ref 0.7–4.0)
MCHC: 34.1 g/dL (ref 30.0–36.0)
MCV: 93.3 fl (ref 78.0–100.0)
Monocytes Absolute: 0.6 10*3/uL (ref 0.1–1.0)
Monocytes Relative: 7.8 % (ref 3.0–12.0)
Neutro Abs: 4 10*3/uL (ref 1.4–7.7)
Neutrophils Relative %: 54.2 % (ref 43.0–77.0)
Platelets: 251 10*3/uL (ref 150.0–400.0)
RBC: 4.66 Mil/uL (ref 3.87–5.11)
RDW: 12.9 % (ref 11.5–15.5)
WBC: 7.3 10*3/uL (ref 4.0–10.5)

## 2020-11-29 LAB — BASIC METABOLIC PANEL
BUN: 12 mg/dL (ref 6–23)
CO2: 26 mEq/L (ref 19–32)
Calcium: 9.6 mg/dL (ref 8.4–10.5)
Chloride: 100 mEq/L (ref 96–112)
Creatinine, Ser: 0.75 mg/dL (ref 0.40–1.20)
GFR: 77.52 mL/min (ref >60.00)
Glucose, Bld: 147 mg/dL — ABNORMAL HIGH (ref 70–99)
Potassium: 4.1 mEq/L (ref 3.5–5.1)
Sodium: 137 mEq/L (ref 135–145)

## 2020-11-29 LAB — HEPATIC FUNCTION PANEL
ALT: 38 U/L — ABNORMAL HIGH (ref 0–35)
AST: 23 U/L (ref 0–37)
Albumin: 4.3 g/dL (ref 3.5–5.2)
Alkaline Phosphatase: 53 U/L (ref 39–117)
Bilirubin, Direct: 0.2 mg/dL (ref 0.0–0.3)
Total Bilirubin: 1.3 mg/dL — ABNORMAL HIGH (ref 0.2–1.2)
Total Protein: 7.1 g/dL (ref 6.0–8.3)

## 2020-11-29 LAB — HEMOGLOBIN A1C: Hgb A1c MFr Bld: 7.6 % — ABNORMAL HIGH (ref 4.6–6.5)

## 2020-11-29 LAB — TSH: TSH: 3.97 u[IU]/mL (ref 0.35–5.50)

## 2020-11-29 MED ORDER — NYSTATIN 100000 UNIT/GM EX CREA: TOPICAL_CREAM | CUTANEOUS | 3 refills | Status: DC

## 2020-11-29 NOTE — Progress Notes (Signed)
Subjective:    Patient ID: Sandra Bailey, female    DOB: April 07, 1945, 75 y.o.   MRN: [id number]  HPI CPE- UTD on PNA vaccines, due for flu- declines.  UTD on eye exam (per report), due for foot exam, microalbumin.  Pt to schedule mammo, DEXA.  No longer doing colon cancer screening.  Patient Care Team    Relationship Specialty Notifications Start End  Midge Minium, MD PCP - General Family Medicine  08/19/15   Faylene Million, MD Referring Physician Internal Medicine  08/19/15    Comment: cardiology  Harle Stanford, MD Referring Physician Urology  08/19/15   Victory Dakin, MD Referring Physician Optometry  08/19/15   Jasmine Awe, MD Referring Physician Ophthalmology  11/30/15   Sandford Craze, MD Referring Physician Dermatology  06/28/16     Health Maintenance  Topic Date Due   OPHTHALMOLOGY EXAM  05/02/2020   INFLUENZA VACCINE  Never done   FOOT EXAM  11/01/2020   URINE MICROALBUMIN  11/01/2020   COVID-19 Vaccine (4 - Booster for Moderna series) 12/15/2020 (Originally 03/24/2020)   Zoster Vaccines- Shingrix (1 of 2) 02/28/2021 (Originally 11/03/1964)   MAMMOGRAM  11/29/2021 (Originally 03/17/2020)   DEXA SCAN  11/29/2021 (Originally 01/25/2020)   TETANUS/TDAP  11/29/2021 (Originally 11/03/1963)   Hepatitis C Screening  11/29/2021 (Originally 11/03/1962)   HEMOGLOBIN A1C  12/02/2020   HPV VACCINES  Aged Out      Review of Systems Patient reports no vision/ hearing changes, adenopathy,fever, weight change,  persistant/recurrent hoarseness, swallowing issues, chest pain, palpitations, edema, persistant/recurrent cough, hemoptysis, dyspnea (rest/exertional/paroxysmal nocturnal), gastrointestinal bleeding (melena, rectal bleeding), abdominal pain, significant heartburn, bowel changes, GU symptoms (dysuria, hematuria, incontinence), Gyn symptoms (abnormal  bleeding, pain),  syncope, focal weakness, memory loss, numbness & tingling, hair/nail changes, abnormal bruising or  bleeding, anxiety, or depression.   + hives- pt reports that since her last COVID booster she has developed intermittent hives along her bra line and waist line.  No change in detergent.  She feels it is related to her Metformin and she decreased her medication to 1 tab daily and feels sxs have somewhat improved.  This visit occurred during the SARS-CoV-2 public health emergency.  Safety protocols were in place, including screening questions prior to the visit, additional usage of staff PPE, and extensive cleaning of exam room while observing appropriate contact time as indicated for disinfecting solutions.      Objective:   Physical Exam General Appearance:    Alert, cooperative, no distress, appears stated age, obese  Head:    Normocephalic, without obvious abnormality, atraumatic  Eyes:    PERRL, conjunctiva/corneas clear, EOM's intact, fundi    benign, both eyes  Ears:    Normal TM's and external ear canals, both ears  Nose:   Deferred due to COVID  Throat:   Neck:   Supple, symmetrical, trachea midline, no adenopathy;    Thyroid: no enlargement/tenderness/nodules  Back:     Symmetric, no curvature, ROM normal, no CVA tenderness  Lungs:     Clear to auscultation bilaterally, respirations unlabored  Chest Wall:    No tenderness or deformity   Heart:    Regular rate and rhythm, S1 and S2 normal, no murmur, rub   or gallop  Breast Exam:    Deferred to mammo  Abdomen:     Soft, non-tender, bowel sounds active all four quadrants,    no masses, no organomegaly  Genitalia:    Deferred  Rectal:  Extremities:   Extremities normal, atraumatic, no cyanosis or edema  Pulses:   2+ and symmetric all extremities  Skin:   Skin color, texture, turgor normal, no rashes or lesions  Lymph nodes:   Cervical, supraclavicular, and axillary nodes normal  Neurologic:   CNII-XII intact, normal strength, sensation and reflexes    throughout          Assessment & Plan:

## 2020-11-29 NOTE — Assessment & Plan Note (Signed)
Pt's PE unchanged from previous and WNL w/ exception of obesity.  Due for mammo and DEXA- pt to schedule.  No longer doing colon cancer screening.  UTD on PNA vaccines.  Declines flu.  Check labs.  Anticipatory guidance provided.

## 2020-11-29 NOTE — Assessment & Plan Note (Signed)
Due for DEXA.  Pt to schedule.  Check Vit D and replete prn.

## 2020-11-29 NOTE — Assessment & Plan Note (Signed)
Chronic problem.  Pt decreased her Metformin to 1 tab daily due to GI issues and intermittent hives.  She reports both have improved w/ decreased dose.  Check labs.  Adjust meds prn

## 2020-11-29 NOTE — Patient Instructions (Addendum)
Follow up in 3-4 months to recheck diabetes We'll notify you of your lab results and make any changes if needed Continue to work on healthy diet and regular exercise- you can do it! Call and schedule your mammogram and bone density tests and have them send me a copy! Call with any questions or concerns Happy Fall!!

## 2020-12-01 ENCOUNTER — Other Ambulatory Visit: Payer: Self-pay

## 2020-12-01 DIAGNOSIS — E119 Type 2 diabetes mellitus without complications: Secondary | ICD-10-CM

## 2020-12-01 MED ORDER — METFORMIN HCL ER 500 MG PO TB24: 500.0000 mg | ORAL_TABLET | Freq: Every day | ORAL | 1 refills | Status: DC

## 2021-01-03 ENCOUNTER — Telehealth (INDEPENDENT_AMBULATORY_CARE_PROVIDER_SITE_OTHER): Payer: Medicare HMO | Admitting: Registered Nurse

## 2021-01-03 ENCOUNTER — Telehealth: Payer: Medicare HMO | Admitting: Registered Nurse

## 2021-01-03 ENCOUNTER — Encounter: Payer: Self-pay | Admitting: Registered Nurse

## 2021-01-03 ENCOUNTER — Other Ambulatory Visit: Payer: Self-pay

## 2021-01-03 DIAGNOSIS — H00022 Hordeolum internum right lower eyelid: Secondary | ICD-10-CM | POA: Diagnosis not present

## 2021-01-03 MED ORDER — ERYTHROMYCIN 5 MG/GM OP OINT: 1.0000 "application " | TOPICAL_OINTMENT | Freq: Three times a day (TID) | OPHTHALMIC | 0 refills | Status: DC

## 2021-01-03 NOTE — Progress Notes (Signed)
Telemedicine Encounter- SOAP NOTE Established Patient  This telephone encounter was conducted with the patient's (or proxy's) verbal consent via audio telecommunications: yes/no: Yes Patient was instructed to have this encounter in a suitably private space; and to only have persons present to whom they give permission to participate. In addition, patient identity was confirmed by use of name plus two identifiers (DOB and address).  I discussed the limitations, risks, security and privacy concerns of performing an evaluation and management service by telephone and the availability of in person appointments. I also discussed with the patient that there may be a patient responsible charge related to this service. The patient expressed understanding and agreed to proceed.  I spent a total of 17 minutes talking with the patient or their proxy.  Patient at home Provider in office  Participants: Kathrin Ruddy, NP and Juluis Rainier  Chief Complaint  Patient presents with   Stye    Patient states she has a stye on her right eye since saturday. Pt states that her eye is so sore and that's her good eye.She has been using hot compress at the moment.    Subjective   Sandra Bailey is a 75 y.o. established patient. Telephone visit today for hordeolum  HPI Onset Saturday Worsening. Notes she has been using hot compresses 3-4 times a day since. Unfortunately limited relief. Notes that this is on her right eye, which has been the better of her two eyes. L eye is heavily affected by macular degeneration.  Hx of styes - in 72s, and in 52s.   Some crusting around eye but no purulent drainage.  Visual acuity is somewhat limited by some blurring in the eye.   Patient Active Problem List   Diagnosis Date Noted   Physical exam 10/29/2018   Hypertriglyceridemia 10/17/2016   Osteoporosis 10/17/2016   Type 2 diabetes mellitus without complications ([place]) 36/14/4315   History of kidney stones  08/19/2015   History of gallstones 08/19/2015   Obesity (BMI 30.0-34.9) 08/19/2015    Past Medical History:  Diagnosis Date   Diabetes mellitus without complication (HCC)    diet controlled   Heart murmur    Kidney stones    Macular degeneration    UTI (lower urinary tract infection)     Current Outpatient Medications  Medication Sig Dispense Refill   calcium carbonate (OSCAL) 1500 (600 Ca) MG TABS tablet Take by mouth 2 (two) times daily with a meal.     erythromycin ophthalmic ointment Place 1 application into the right eye 3 (three) times daily. 14 g 0   metFORMIN (GLUCOPHAGE XR) 500 MG 24 hr tablet Take 1 tablet (500 mg total) by mouth daily with breakfast. 90 tablet 1   metFORMIN (GLUCOPHAGE) 500 MG tablet TAKE 1 TABLET BY MOUTH TWICE A DAY WITH MEALS (Patient taking differently: 1 tab daily) 180 tablet 1   Multiple Vitamins-Minerals (CENTRUM SILVER 50+WOMEN) TABS Take by mouth.     Multiple Vitamins-Minerals (PRESERVISION AREDS 2) CAPS Take 2 capsules by mouth daily.      nystatin cream (MYCOSTATIN) APPLY TO AFFECTED AREA TWICE A DAY 30 g 3   Omega-3 Fatty Acids (FISH OIL) 1000 MG CAPS Take by mouth.     Triamcinolone Acetonide (NASACORT ALLERGY 24HR NA) Place into the nose.     No current facility-administered medications for this visit.    Allergies  Allergen Reactions   Macrodantin [Nitrofurantoin Macrocrystal] Other (See Comments)    Pt states it caused weakness in her  20's   Silver Sulfadiazine Swelling    Tongue swelling    Social History   Socioeconomic History   Marital status: Married    Spouse name: Not on file   Number of children: 2   Years of education: Not on file   Highest education level: Not on file  Occupational History   Occupation: Retired     Comment: data entry   Tobacco Use   Smoking status: Never   Smokeless tobacco: Never  Vaping Use   Vaping Use: Never used  Substance and Sexual Activity   Alcohol use: Yes   Drug use: No    Sexual activity: Not Currently  Other Topics Concern   Not on file  Social History Narrative   2 Sons    4 Grandchildren (girls)    [place], spending time with cousin    Social Determinants of Radio broadcast assistant Strain: Low Risk    Difficulty of Paying Living Expenses: Not hard at all  Food Insecurity: No Food Insecurity   Worried About Charity fundraiser in the Last Year: Never true   Arboriculturist in the Last Year: Never true  Transportation Needs: No Transportation Needs   Lack of Transportation (Medical): No   Lack of Transportation (Non-Medical): No  Physical Activity: Insufficiently Active   Days of Exercise per Week: 4 days   Minutes of Exercise per Session: 30 min  Stress: No Stress Concern Present   Feeling of Stress : Not at all  Social Connections: Moderately Integrated   Frequency of Communication with Friends and Family: More than three times a week   Frequency of Social Gatherings with Friends and Family: More than three times a week   Attends Religious Services: Never   Marine scientist or Organizations: Yes   Attends Archivist Meetings: 1 to 4 times per year   Marital Status: Married  Human resources officer Violence: Not At Risk   Fear of Current or Ex-Partner: No   Emotionally Abused: No   Physically Abused: No   Sexually Abused: No    Review of Systems  Constitutional: Negative.   HENT: Negative.    Eyes:  Positive for pain. Negative for blurred vision, double vision, photophobia, discharge and redness.  Respiratory: Negative.    Cardiovascular: Negative.   Gastrointestinal: Negative.   Genitourinary: Negative.   Musculoskeletal: Negative.   Skin: Negative.   Neurological: Negative.   Endo/Heme/Allergies: Negative.   Psychiatric/Behavioral: Negative.    All other systems reviewed and are negative.  Objective   Vitals as reported by the patient: There were no vitals filed for this visit.  Sandra Bailey was seen today  for stye.  Diagnoses and all orders for this visit:  Hordeolum internum of right lower eyelid -     erythromycin ophthalmic ointment; Place 1 application into the right eye 3 (three) times daily.   PLAN Given lack of improvement with conservative measures, will give erythromycin as above.  Low threshold for in person assessment discussed with patient, who voiced understanding and will seek urgent care or ER if no improvement in next 24-48 hours, or any acute worsening. She will follow up with her ophthalmologist within 1 mo Patient encouraged to call clinic with any questions, comments, or concerns.  I discussed the assessment and treatment plan with the patient. The patient was provided an opportunity to ask questions and all were answered. The patient agreed with the plan and demonstrated an understanding of  the instructions.   The patient was advised to call back or seek an in-person evaluation if the symptoms worsen or if the condition fails to improve as anticipated.  I provided 17 minutes of non-face-to-face time during this encounter.  Maximiano Coss, NP

## 2021-01-12 ENCOUNTER — Other Ambulatory Visit: Payer: Self-pay

## 2021-01-12 ENCOUNTER — Telehealth: Payer: Self-pay

## 2021-01-12 DIAGNOSIS — M810 Age-related osteoporosis without current pathological fracture: Secondary | ICD-10-CM

## 2021-01-12 NOTE — Telephone Encounter (Signed)
Dr Birdie Riddle, ok to order and send to requested facility?

## 2021-01-12 NOTE — Telephone Encounter (Signed)
Caller name:Kenedee [place]   On DPR? :Yes  Call back [place]  Provider they see: Birdie Riddle  Reason for call:Pt needs order for Bone Density Test in [place] at [facility] Fax [phone]

## 2021-01-12 NOTE — Telephone Encounter (Signed)
Pt also provided phone number [phone] if you need to contact them

## 2021-01-12 NOTE — Telephone Encounter (Signed)
Dexa scan ordered and sent to patient requested facility.

## 2021-02-01 ENCOUNTER — Ambulatory Visit: Payer: Medicare HMO | Admitting: Registered Nurse

## 2021-03-31 ENCOUNTER — Ambulatory Visit: Payer: Medicare HMO | Admitting: Family Medicine

## 2021-03-31 ENCOUNTER — Encounter: Payer: Self-pay | Admitting: Family Medicine

## 2021-03-31 ENCOUNTER — Ambulatory Visit (INDEPENDENT_AMBULATORY_CARE_PROVIDER_SITE_OTHER): Payer: Medicare HMO | Admitting: Family Medicine

## 2021-03-31 VITALS — BP 132/82 | HR 68 | Temp 98.1°F | Resp 16 | Wt 185.0 lb

## 2021-03-31 DIAGNOSIS — E119 Type 2 diabetes mellitus without complications: Secondary | ICD-10-CM

## 2021-03-31 DIAGNOSIS — L509 Urticaria, unspecified: Secondary | ICD-10-CM | POA: Diagnosis not present

## 2021-03-31 LAB — BASIC METABOLIC PANEL
BUN: 14 mg/dL (ref 6–23)
CO2: 29 mEq/L (ref 19–32)
Calcium: 9.7 mg/dL (ref 8.4–10.5)
Chloride: 100 mEq/L (ref 96–112)
Creatinine, Ser: 0.8 mg/dL (ref 0.40–1.20)
GFR: 71.58 mL/min (ref >60.00)
Glucose, Bld: 177 mg/dL — ABNORMAL HIGH (ref 70–99)
Potassium: 4.2 mEq/L (ref 3.5–5.1)
Sodium: 137 mEq/L (ref 135–145)

## 2021-03-31 LAB — TSH: TSH: 3.54 u[IU]/mL (ref 0.35–5.50)

## 2021-03-31 MED ORDER — CETIRIZINE HCL 10 MG PO TABS: 10.0000 mg | ORAL_TABLET | Freq: Every day | ORAL | 1 refills | Status: DC

## 2021-03-31 NOTE — Progress Notes (Signed)
° °  Subjective:    Patient ID: Sandra Bailey, female    DOB: 11/28/1945, 76 y.o.   MRN: [id number]  HPI DM- chronic problem, on Metformin XR 500mg  daily.  Pt reports even w/ switching to the extended release, 'it still bothers my stomach'.  Last A1C 7.6%.  UTD on foot exam, eye exam, microalbumin.  No CP, SOB, HAs, visual changes, edema.  Denies symptomatic lows.  No numbness/tingling of hands/feet.  + dry skin.  Hives- pt reports she will intermittently break out in hives.  This has been happening for 'years'.  Currently has a hive on her R back at level of bra line.  She has not been able to determine a cause.  Current episode started on Wednesday.  Will take Benadryl as needed.  Pt is not interested in seeing an allergist.   Review of Systems For ROS see HPI   This visit occurred during the SARS-CoV-2 public health emergency.  Safety protocols were in place, including screening questions prior to the visit, additional usage of staff PPE, and extensive cleaning of exam room while observing appropriate contact time as indicated for disinfecting solutions.      Objective:   Physical Exam Vitals reviewed.  Constitutional:      General: She is not in acute distress.    Appearance: Normal appearance. She is well-developed. She is not ill-appearing.  HENT:     Head: Normocephalic and atraumatic.  Eyes:     Conjunctiva/sclera: Conjunctivae normal.     Pupils: Pupils are equal, round, and reactive to light.  Neck:     Thyroid: No thyromegaly.  Cardiovascular:     Rate and Rhythm: Normal rate and regular rhythm.     Pulses: Normal pulses.     Heart sounds: Normal heart sounds. No murmur heard. Pulmonary:     Effort: Pulmonary effort is normal. No respiratory distress.     Breath sounds: Normal breath sounds.  Abdominal:     General: There is no distension.     Palpations: Abdomen is soft.     Tenderness: There is no abdominal tenderness.  Musculoskeletal:     Cervical back: Normal  range of motion and neck supple.     Right lower leg: No edema.     Left lower leg: No edema.  Lymphadenopathy:     Cervical: No cervical adenopathy.  Skin:    General: Skin is warm and dry.     Comments: Erythematous hive at R bra line  Neurological:     Mental Status: She is alert and oriented to person, place, and time. Mental status is at baseline.  Psychiatric:        Mood and Affect: Mood normal.        Behavior: Behavior normal.          Assessment & Plan:

## 2021-03-31 NOTE — Assessment & Plan Note (Signed)
Chronic problem.  On Metformin XR 500mg  daily.  She says despite changing to the extended release, she still has some stomach issues.  She would like to stop this if possible.  UTD on foot exam, eye exam, microalbumin.  Check labs.  Adjust meds prn

## 2021-03-31 NOTE — Patient Instructions (Signed)
Follow up in 3-4 months to recheck diabetes We'll notify you of your lab results and make any changes if needed START taking Zyrtec daily to help prevent hives IF you have another episode of hives, please write down all that you had to eat or drink that day IF you have swelling of mouth, tongue, or throat- please call 911 If you change your mind about the allergist- please let me know Call with any questions or concerns Stay Safe!  Stay Healthy! Happy New Year!!!

## 2021-03-31 NOTE — Assessment & Plan Note (Signed)
New.  Pt reports she will intermittently get hives and she can't identify a particular trigger.  She says she will also develop swelling of lips and tongue at times.  She states this has always been controlled w/ Benadryl.  I strongly encouraged pt to see an allergist- particularly given her mouth and throat sxs- but she declines.  Discussed the appropriate use of an Epi-pen (which she has at home) and the need to call 911 if her tongue or lips or throat are swelling.  Since she is refusing to see allergist, will start her on daily Zyrtec in hopes of preventing this response.  Pt expressed understanding and is in agreement w/ plan.

## 2021-04-03 LAB — HEMOGLOBIN A1C: Hgb A1c MFr Bld: 7.8 % — ABNORMAL HIGH (ref 4.6–6.5)

## 2021-04-04 ENCOUNTER — Telehealth: Payer: Self-pay

## 2021-04-04 NOTE — Telephone Encounter (Signed)
-----   Message from Midge Minium, MD sent at 04/03/2021 12:38 PM EST ----- A1C keeps creeping up.  It has gone from 6.5% a year ago to 7.8%.  Try and eat a low carb diet and get regular physical activity to prevent this from climbing and requiring [place] to add on additional medication.

## 2021-04-04 NOTE — Telephone Encounter (Signed)
Spoke to patient and she is aware of labs.

## 2021-05-12 ENCOUNTER — Other Ambulatory Visit: Payer: Self-pay | Admitting: Family Medicine

## 2021-05-12 DIAGNOSIS — E119 Type 2 diabetes mellitus without complications: Secondary | ICD-10-CM

## 2021-08-01 ENCOUNTER — Ambulatory Visit: Payer: Medicare HMO | Admitting: Family Medicine

## 2021-08-02 ENCOUNTER — Encounter: Payer: Self-pay | Admitting: Family Medicine

## 2021-08-02 ENCOUNTER — Ambulatory Visit (INDEPENDENT_AMBULATORY_CARE_PROVIDER_SITE_OTHER): Payer: Medicare HMO | Admitting: Family Medicine

## 2021-08-02 VITALS — BP 161/78 | HR 77 | Temp 98.2°F | Ht 63.0 in | Wt 181.0 lb

## 2021-08-02 DIAGNOSIS — E119 Type 2 diabetes mellitus without complications: Secondary | ICD-10-CM

## 2021-08-02 DIAGNOSIS — R030 Elevated blood-pressure reading, without diagnosis of hypertension: Secondary | ICD-10-CM | POA: Diagnosis not present

## 2021-08-02 DIAGNOSIS — E781 Pure hyperglyceridemia: Secondary | ICD-10-CM | POA: Diagnosis not present

## 2021-08-02 DIAGNOSIS — E669 Obesity, unspecified: Secondary | ICD-10-CM

## 2021-08-02 LAB — BASIC METABOLIC PANEL
BUN: 11 mg/dL (ref 6–23)
CO2: 28 mEq/L (ref 19–32)
Calcium: 9.5 mg/dL (ref 8.4–10.5)
Chloride: 99 mEq/L (ref 96–112)
Creatinine, Ser: 0.79 mg/dL (ref 0.40–1.20)
GFR: 72.49 mL/min (ref >60.00)
Glucose, Bld: 240 mg/dL — ABNORMAL HIGH (ref 70–99)
Potassium: 4.2 mEq/L (ref 3.5–5.1)
Sodium: 137 mEq/L (ref 135–145)

## 2021-08-02 LAB — HEPATIC FUNCTION PANEL
ALT: 22 U/L (ref 0–35)
AST: 14 U/L (ref 0–37)
Albumin: 4.1 g/dL (ref 3.5–5.2)
Alkaline Phosphatase: 62 U/L (ref 39–117)
Bilirubin, Direct: 0.2 mg/dL (ref 0.0–0.3)
Total Bilirubin: 1.3 mg/dL — ABNORMAL HIGH (ref 0.2–1.2)
Total Protein: 6.9 g/dL (ref 6.0–8.3)

## 2021-08-02 LAB — CBC WITH DIFFERENTIAL/PLATELET
Basophils Absolute: 0.1 10*3/uL (ref 0.0–0.1)
Basophils Relative: 0.9 % (ref 0.0–3.0)
Eosinophils Absolute: 0.2 10*3/uL (ref 0.0–0.7)
Eosinophils Relative: 3.9 % (ref 0.0–5.0)
HCT: 43.4 % (ref 36.0–46.0)
Hemoglobin: 14.9 g/dL (ref 12.0–15.0)
Lymphocytes Relative: 28.6 % (ref 12.0–46.0)
Lymphs Abs: 1.7 10*3/uL (ref 0.7–4.0)
MCHC: 34.4 g/dL (ref 30.0–36.0)
MCV: 92.1 fl (ref 78.0–100.0)
Monocytes Absolute: 0.5 10*3/uL (ref 0.1–1.0)
Monocytes Relative: 7.9 % (ref 3.0–12.0)
Neutro Abs: 3.6 10*3/uL (ref 1.4–7.7)
Neutrophils Relative %: 58.7 % (ref 43.0–77.0)
Platelets: 245 10*3/uL (ref 150.0–400.0)
RBC: 4.71 Mil/uL (ref 3.87–5.11)
RDW: 12.9 % (ref 11.5–15.5)
WBC: 6.1 10*3/uL (ref 4.0–10.5)

## 2021-08-02 LAB — LIPID PANEL
Cholesterol: 159 mg/dL (ref 0–200)
HDL: 56.5 mg/dL (ref >39.00)
NonHDL: 102.35
Total CHOL/HDL Ratio: 3
Triglycerides: 226 mg/dL — ABNORMAL HIGH (ref 0.0–149.0)
VLDL: 45.2 mg/dL — ABNORMAL HIGH (ref 0.0–40.0)

## 2021-08-02 LAB — HEMOGLOBIN A1C: Hgb A1c MFr Bld: 9.8 % — ABNORMAL HIGH (ref 4.6–6.5)

## 2021-08-02 LAB — LDL CHOLESTEROL, DIRECT: Direct LDL: 94 mg/dL

## 2021-08-02 LAB — TSH: TSH: 3.3 u[IU]/mL (ref 0.35–5.50)

## 2021-08-02 NOTE — Progress Notes (Signed)
   Subjective:    Patient ID: Sandra Bailey, female    DOB: [birth year], 76 y.o.   MRN: [id number]  HPI Elevated BP- new.  Pt's last BP was 132/82 but today is 161/78.  Pt reports she was rushing across the parking lot to get here.  DM- chronic problem, on Metformin XR '500mg'$  daily.  UTD on eye exam, foot exam, microalbumin.  Last A1C 7.8%  Denies CP, SOB, HAs, visual changes, edema.  No abd pain, N/V.  No numbness/tingling of hands/feet.  Obesity- chronic problem.  Pt is down 4 lbs since last visit.  No regular exercise.   Review of Systems For ROS see HPI     Objective:   Physical Exam Vitals reviewed.  Constitutional:      General: She is not in acute distress.    Appearance: Normal appearance. She is well-developed. She is obese. She is not ill-appearing.  HENT:     Head: Normocephalic and atraumatic.  Eyes:     Conjunctiva/sclera: Conjunctivae normal.     Pupils: Pupils are equal, round, and reactive to light.  Neck:     Thyroid: No thyromegaly.  Cardiovascular:     Rate and Rhythm: Normal rate and regular rhythm.     Pulses: Normal pulses.     Heart sounds: Murmur (II/VI SEM) heard.  Pulmonary:     Effort: Pulmonary effort is normal. No respiratory distress.     Breath sounds: Normal breath sounds.  Abdominal:     General: There is no distension.     Palpations: Abdomen is soft.     Tenderness: There is no abdominal tenderness.  Musculoskeletal:     Cervical back: Normal range of motion and neck supple.     Right lower leg: No edema.     Left lower leg: No edema.  Lymphadenopathy:     Cervical: No cervical adenopathy.  Skin:    General: Skin is warm and dry.  Neurological:     General: No focal deficit present.     Mental Status: She is alert and oriented to person, place, and time.  Psychiatric:        Mood and Affect: Mood normal.        Behavior: Behavior normal.          Assessment & Plan:

## 2021-08-02 NOTE — Assessment & Plan Note (Signed)
Pt is down 4 lbs since last visit!  Applauded her efforts.  Will continue to follow.

## 2021-08-02 NOTE — Patient Instructions (Signed)
Schedule your complete physical for late September We'll notify you of your lab results and make any changes if needed Keep up the good work on healthy diet and regular exercise- you can do it! Call with any questions or concerns Stay Safe!  Stay healthy! Have a great summer!!!

## 2021-08-02 NOTE — Assessment & Plan Note (Signed)
New.  Pt's BP is elevated today and has not been elevated in the past.  She feels like it was due to rushing to get here and feeling 'flustered'.  It remained high on recheck.  Will have pt return in 3-4 weeks to reassess BP and start meds prn.  Pt expressed understanding and is in agreement w/ plan.

## 2021-08-02 NOTE — Assessment & Plan Note (Signed)
Chronic problem for pt.  Check labs and determine if medication is needed.

## 2021-08-02 NOTE — Assessment & Plan Note (Signed)
Chronic problem.  Currently tolerating Metformin XR '500mg'$  daily w/o difficulty.  UTD on eye exam, foot exam, and microalbumin.  Check labs.  Adjust meds prn

## 2021-08-04 ENCOUNTER — Telehealth: Payer: Self-pay

## 2021-08-04 DIAGNOSIS — E119 Type 2 diabetes mellitus without complications: Secondary | ICD-10-CM

## 2021-08-04 NOTE — Telephone Encounter (Signed)
Pt notes recent trip to the beach and there was a lot of junk food thinks this is contributing to the jump and wants to have recheck in a few weeks

## 2021-08-04 NOTE — Telephone Encounter (Signed)
Pt is asking if it is okay for her to take 2 daily of her XR metformin or if she should get a new rx for IR dose

## 2021-08-04 NOTE — Telephone Encounter (Signed)
-----   Message from Midge Minium, MD sent at 08/03/2021  7:50 AM EDT ----- Your A1C has jumped up quite a bit!  This indicates very poor sugar control.  Please make sure you are eating a low carb/low sugar diet and getting regular physical activity such as walking.  In the meantime, we need to increase your Metformin to twice daily.  Remainder of labs are stable

## 2021-08-04 NOTE — Telephone Encounter (Signed)
The A1C can only be checked every 3 months (both per insurance and as a physiologic function b/c it takes that long to see change).  That's b/c the A1C is a 90 day average.  I would still increase the Metformin to twice daily while she works on a better diet and we will recheck her A1C at her next visit.

## 2021-08-06 NOTE — Telephone Encounter (Signed)
Ok to take her extended release Metformin twice daily

## 2021-08-07 MED ORDER — METFORMIN HCL ER 500 MG PO TB24: ORAL_TABLET | ORAL | 1 refills | Status: DC

## 2021-08-07 NOTE — Addendum Note (Signed)
Addended by: Patrcia Dolly on: 08/07/2021 09:53 AM   Modules accepted: Orders

## 2021-08-17 ENCOUNTER — Telehealth: Payer: Self-pay

## 2021-08-17 ENCOUNTER — Ambulatory Visit (INDEPENDENT_AMBULATORY_CARE_PROVIDER_SITE_OTHER): Payer: Medicare HMO

## 2021-08-17 DIAGNOSIS — Z0000 Encounter for general adult medical examination without abnormal findings: Secondary | ICD-10-CM

## 2021-08-17 NOTE — Progress Notes (Signed)
Subjective:   Sandra Bailey is a 76 y.o. female who presents for Medicare Annual (Subsequent) preventive examination.   I connected with Yasmen Cortner today by telephone and verified that I am speaking with the correct person using two identifiers. Location patient: home Location provider: work Persons participating in the virtual visit: patient, provider.   I discussed the limitations, risks, security and privacy concerns of performing an evaluation and management service by telephone and the availability of in person appointments. I also discussed with the patient that there may be a patient responsible charge related to this service. The patient expressed understanding and verbally consented to this telephonic visit.    Interactive audio and video telecommunications were attempted between this provider and patient, however failed, due to patient having technical difficulties OR patient did not have access to video capability.  We continued and completed visit with audio only.    Review of Systems     Cardiac Risk Factors include: advanced age (>75mn, >>47women);diabetes mellitus     Objective:    Today's Vitals   There is no height or weight on file to calculate BMI.     08/17/2021   11:04 AM 07/04/2020   11:19 AM 07/01/2019   11:23 AM 07/03/2017   10:32 AM 06/28/2016   10:03 AM  Advanced Directives  Does Patient Have a Medical Advance Directive? Yes No No No No  Type of [place] will      Copy of [place]? No - copy requested      Would patient like information on creating a medical advance directive?  No - Patient declined Yes (MAU/Ambulatory/Procedural Areas - Information given) Yes (MAU/Ambulatory/Procedural Areas - Information given) Yes (MAU/Ambulatory/Procedural Areas - Information given)    Current Medications (verified) Outpatient Encounter Medications as of 08/17/2021  Medication Sig    Biotin w/ Vitamins C & E (HAIR/SKIN/NAILS PO) Take by mouth.   calcium carbonate (OSCAL) 1500 (600 Ca) MG TABS tablet Take by mouth 2 (two) times daily with a meal.   levocetirizine (XYZAL ALLERGY 24HR) 5 MG tablet Take 5 mg by mouth every evening.   metFORMIN (GLUCOPHAGE-XR) 500 MG 24 hr tablet TAKE 1 TABLET BY MOUTH TWO TIMES DAILY   Multiple Vitamins-Minerals (CENTRUM SILVER 50+WOMEN) TABS Take by mouth.   Multiple Vitamins-Minerals (PRESERVISION AREDS 2) CAPS Take 2 capsules by mouth daily.    nystatin cream (MYCOSTATIN) APPLY TO AFFECTED AREA TWICE A DAY   penicillin v potassium (VEETID) 500 MG tablet Take 500 mg by mouth 4 (four) times daily.   Triamcinolone Acetonide (NASACORT ALLERGY 24HR NA) Place into the nose.   White Petrolatum-Mineral Oil (ARTIFICIAL TEARS) ointment as needed.   No facility-administered encounter medications on file as of 08/17/2021.    Allergies (verified) Macrodantin [nitrofurantoin macrocrystal] and Silver sulfadiazine   History: Past Medical History:  Diagnosis Date   Diabetes mellitus without complication (HCC)    diet controlled   Heart murmur    Kidney stones    Macular degeneration    UTI (lower urinary tract infection)    Past Surgical History:  Procedure Laterality Date   ABDOMINAL HYSTERECTOMY     cataracts     EYE SURGERY  10/28/2019   catarat surgery left eye   EYE SURGERY  10/19/2019   catarat surgery right eye   LITHOTRIPSY     Family History  Problem Relation Age of Onset   Macular degeneration Mother  Hypertension Father    Glaucoma Sister    Hypertension Sister    Heart disease Brother    Panic disorder Son    Social History   Socioeconomic History   Marital status: Married    Spouse name: Not on file   Number of children: 2   Years of education: Not on file   Highest education level: Not on file  Occupational History   Occupation: Retired     Comment: data entry   Tobacco Use   Smoking status: Never    Smokeless tobacco: Never  Vaping Use   Vaping Use: Never used  Substance and Sexual Activity   Alcohol use: Yes   Drug use: No   Sexual activity: Not Currently  Other Topics Concern   Not on file  Social History Narrative   2 Sons    4 Grandchildren (girls)    [place], spending time with cousin    Social Determinants of Health   Financial Resource Strain: Low Risk  (08/17/2021)   Overall Financial Resource Strain (CARDIA)    Difficulty of Paying Living Expenses: Not hard at all  Food Insecurity: No Food Insecurity (08/17/2021)   Hunger Vital Sign    Worried About Running Out of Food in the Last Year: Never true    [place] in the Last Year: Never true  Transportation Needs: No Transportation Needs (08/17/2021)   PRAPARE - Hydrologist (Medical): No    Lack of Transportation (Non-Medical): No  Physical Activity: Insufficiently Active (08/17/2021)   Exercise Vital Sign    Days of Exercise per Week: 3 days    Minutes of Exercise per Session: 30 min  Stress: No Stress Concern Present (08/17/2021)   [place]    Feeling of Stress : Not at all  Social Connections: Moderately Integrated (08/17/2021)   Social Connection and Isolation Panel [NHANES]    Frequency of Communication with Friends and Family: Three times a week    Frequency of Social Gatherings with Friends and Family: Three times a week    Attends Religious Services: Never    Active Member of Clubs or Organizations: Yes    Attends Music therapist: More than 4 times per year    Marital Status: Married    Tobacco Counseling Counseling given: Not Answered   Clinical Intake:  Pre-visit preparation completed: Yes  Pain : No/denies pain     Nutritional Risks: None Diabetes: Yes CBG done?: No Did pt. bring in CBG monitor from home?: No  How often do you need to have someone help you when you  read instructions, pamphlets, or other written materials from your doctor or pharmacy?: 1 - Never What is the last grade level you completed in school?: college  Diabetic?yes  Nutrition Risk Assessment:  Has the patient had any N/V/D within the last 2 months?  No  Does the patient have any non-healing wounds?  No  Has the patient had any unintentional weight loss or weight gain?  No   Diabetes:  Is the patient diabetic?  Yes  If diabetic, was a CBG obtained today?  No  Did the patient bring in their glucometer from home?  No  How often do you monitor your CBG's? Never .   Financial Strains and Diabetes Management:  Are you having any financial strains with the device, your supplies or your medication? No .  Does the patient want  to be seen by Chronic Care Management for management of their diabetes?  No  Would the patient like to be referred to a Nutritionist or for Diabetic Management?  No   Diabetic Exams:  Diabetic Eye Exam: Completed 12/2020 Diabetic Foot Exam: Overdue, Pt has been advised about the importance in completing this exam. Pt is scheduled for diabetic foot exam on next office visit .   Interpreter Needed?: No  Information entered by :: L.Chevy Virgo,LPN   Activities of Daily Living    08/17/2021   11:05 AM 11/29/2020   10:22 AM  In your present state of health, do you have any difficulty performing the following activities:  Hearing? 0 0  Vision? 0 0  Difficulty concentrating or making decisions? 0 0  Walking or climbing stairs? 0 0  Dressing or bathing? 0 0  Doing errands, shopping? 0 0  Preparing Food and eating ? N   Using the Toilet? N   In the past six months, have you accidently leaked urine? N   Do you have problems with loss of bowel control? N   Managing your Medications? N   Managing your Finances? N   Housekeeping or managing your Housekeeping? N     Patient Care Team: Midge Minium, MD as PCP - General (Family Medicine) Faylene Million, MD as Referring Physician (Internal Medicine) Harle Stanford, MD as Referring Physician (Urology) Victory Dakin, MD as Referring Physician (Optometry) Garwin Brothers, Luretha Rued, MD as Referring Physician (Ophthalmology) Sandford Craze, MD as Referring Physician (Dermatology)  Indicate any recent Medical Services you may have received from other than [facility] providers in the past year (date may be approximate).     Assessment:   This is a routine wellness examination for [place].  Hearing/Vision screen Vision Screening - Comments:: Annual eye exams wear glasses   Dietary issues and exercise activities discussed: Current Exercise Habits: Home exercise routine, Type of exercise: walking, Time (Minutes): 30, Frequency (Times/Week): 3, Weekly Exercise (Minutes/Week): 90, Intensity: Mild, Exercise limited by: None identified   Goals Addressed             This Visit's Progress    Patient Stated   On track    Eat healthier       Depression Screen    08/17/2021   11:05 AM 08/17/2021   11:03 AM 08/02/2021   11:24 AM 08/02/2021   10:49 AM 03/31/2021    9:27 AM 01/03/2021    9:16 AM 11/29/2020   10:21 AM  PHQ 2/9 Scores  PHQ - 2 Score 0 0 0 0 0 0 0  PHQ- 9 Score   2 1 0 0 1    Fall Risk    08/17/2021   11:05 AM 03/31/2021    9:27 AM 01/03/2021    9:16 AM 11/29/2020   10:21 AM 07/04/2020   11:22 AM  Fall Risk   Falls in the past year? 0 1 0 0 0  Number falls in past yr: 0 0 0  0  Injury with Fall? 0 1 0  0  Risk for fall due to :  History of fall(s) No Fall Risks No Fall Risks   Follow up Falls evaluation completed;Education provided Falls evaluation completed Falls evaluation completed Falls evaluation completed Falls prevention discussed    FALL RISK PREVENTION PERTAINING TO THE HOME:  Any stairs in or around the home? No  If so, are there any without handrails? No  Home free of loose throw rugs in  walkways, pet beds, electrical cords, etc? Yes  Adequate lighting in your home  to reduce risk of falls? Yes   ASSISTIVE DEVICES UTILIZED TO PREVENT FALLS:  Life alert? No  Use of a cane, walker or w/c? No  Grab bars in the bathroom? Yes  Shower chair or bench in shower? Yes  Elevated toilet seat or a handicapped toilet? No  Cognitive Function:Normal cognitive status assessed by telephone conversation  by this Nurse Health Advisor. No abnormalities found.      07/03/2017   10:34 AM  MMSE - Mini Mental State Exam  Orientation to time 5  Orientation to Place 5  Registration 3  Attention/ Calculation 3  Recall 3  Language- name 2 objects 2  Language- repeat 1  Language- follow 3 step command 3  Language- read & follow direction 1  Write a sentence 1  Copy design 1  Total score 28        07/01/2019   11:25 AM  6CIT Screen  What Year? 0 points  What month? 0 points  What time? 0 points  Count back from 20 0 points  Months in reverse 0 points  Repeat phrase 0 points  Total Score 0 points    Immunizations Immunization History  Administered Date(s) Administered   Moderna SARS-COV2 Booster Vaccination 12/31/2019   Moderna Sars-Covid-2 Vaccination 05/17/2019, 06/17/2019   Pneumococcal Conjugate-13 11/30/2015   Pneumococcal Polysaccharide-23 07/03/2017    TDAP status: Due, Education has been provided regarding the importance of this vaccine. Advised may receive this vaccine at local pharmacy or Health Dept. Aware to provide a copy of the vaccination record if obtained from local pharmacy or Health Dept. Verbalized acceptance and understanding.  Flu Vaccine status: Declined, Education has been provided regarding the importance of this vaccine but patient still declined. Advised may receive this vaccine at local pharmacy or Health Dept. Aware to provide a copy of the vaccination record if obtained from local pharmacy or Health Dept. Verbalized acceptance and understanding.  Pneumococcal vaccine status: Up to date  Covid-19 vaccine status: Completed  vaccines  Qualifies for Shingles Vaccine? Yes   Zostavax completed No   Shingrix Completed?: No.    Education has been provided regarding the importance of this vaccine. Patient has been advised to call insurance company to determine out of pocket expense if they have not yet received this vaccine. Advised may also receive vaccine at local pharmacy or Health Dept. Verbalized acceptance and understanding.  Screening Tests Health Maintenance  Topic Date Due   Zoster Vaccines- Shingrix (1 of 2) Never done   COVID-19 Vaccine (3 - Moderna series) 02/25/2020   MAMMOGRAM  11/29/2021 (Originally 03/17/2020)   DEXA SCAN  11/29/2021 (Originally 01/25/2020)   TETANUS/TDAP  11/29/2021 (Originally 11/03/1964)   Hepatitis C Screening  11/29/2021 (Originally 11/03/1963)   INFLUENZA VACCINE  10/03/2021   FOOT EXAM  11/29/2021   URINE MICROALBUMIN  11/29/2021   HEMOGLOBIN A1C  02/01/2022   OPHTHALMOLOGY EXAM  03/24/2022   Pneumonia Vaccine 3+ Years old  Completed   HPV VACCINES  Aged Out   Fecal DNA (Cologuard)  Discontinued    Health Maintenance  Health Maintenance Due  Topic Date Due   Zoster Vaccines- Shingrix (1 of 2) Never done   COVID-19 Vaccine (3 - Moderna series) 02/25/2020    Colorectal cancer screening: No longer required.   Mammogram status: No longer required due to age.  Bone Density status: Completed 01/24/2018. Results reflect: Bone density results: OSTEOPENIA. Repeat every  5 years.  Lung Cancer Screening: (Low Dose CT Chest recommended if Age 41-80 years, 30 pack-year currently smoking OR have quit w/in 15years.) does not qualify.   Lung Cancer Screening Referral: n/a  Additional Screening:  Hepatitis C Screening: does not qualify;   Vision Screening: Recommended annual ophthalmology exams for early detection of glaucoma and other disorders of the eye. Is the patient up to date with their annual eye exam?  Yes  Who is the provider or what is the name of the office in  which the patient attends annual eye exams? Dr.Metu  If pt is not established with a provider, would they like to be referred to a provider to establish care? No .   Dental Screening: Recommended annual dental exams for proper oral hygiene  Community Resource Referral / Chronic Care Management: CRR required this visit?  No   CCM required this visit?  No      Plan:     I have personally reviewed and noted the following in the patient's chart:   Medical and social history Use of alcohol, tobacco or illicit drugs  Current medications and supplements including opioid prescriptions.  Functional ability and status Nutritional status Physical activity Advanced directives List of other physicians Hospitalizations, surgeries, and ER visits in previous 12 months Vitals Screenings to include cognitive, depression, and falls Referrals and appointments  In addition, I have reviewed and discussed with patient certain preventive protocols, quality metrics, and best practice recommendations. A written personalized care plan for preventive services as well as general preventive health recommendations were provided to patient.     Randel Pigg, LPN   07/13/209   Nurse Notes: none

## 2021-08-17 NOTE — Telephone Encounter (Signed)
Patient just wanted to let Dr Birdie Riddle know that her dentist has put her on antibx for an infected tooth and she is to take tylenol and ibuprofen prior to the dental work.

## 2021-08-17 NOTE — Patient Instructions (Signed)
Sandra Bailey , Thank you for taking time to come for your Medicare Wellness Visit. I appreciate your ongoing commitment to your health goals. Please review the following plan we discussed and let me know if I can assist you in the future.   Screening recommendations/referrals: Colonoscopy: no longer required  Mammogram: no longer required  Bone Density: 01/24/2018 Recommended yearly ophthalmology/optometry visit for glaucoma screening and checkup Recommended yearly dental visit for hygiene and checkup  Vaccinations: Influenza vaccine: declined  Pneumococcal vaccine: completed  Tdap vaccine: due  Shingles vaccine: will consider     Advanced directives: yes   Conditions/risks identified: none   Next appointment: none    Preventive Care 59 Years and Older, Female Preventive care refers to lifestyle choices and visits with your health care provider that can promote health and wellness. What does preventive care include? A yearly physical exam. This is also called an annual well check. Dental exams once or twice a year. Routine eye exams. Ask your health care provider how often you should have your eyes checked. Personal lifestyle choices, including: Daily care of your teeth and gums. Regular physical activity. Eating a healthy diet. Avoiding tobacco and drug use. Limiting alcohol use. Practicing safe sex. Taking low-dose aspirin every day. Taking vitamin and mineral supplements as recommended by your health care provider. What happens during an annual well check? The services and screenings done by your health care provider during your annual well check will depend on your age, overall health, lifestyle risk factors, and family history of disease. Counseling  Your health care provider may ask you questions about your: Alcohol use. Tobacco use. Drug use. Emotional well-being. Home and relationship well-being. Sexual activity. Eating habits. History of falls. Memory and  ability to understand (cognition). Work and work Statistician. Reproductive health. Screening  You may have the following tests or measurements: Height, weight, and BMI. Blood pressure. Lipid and cholesterol levels. These may be checked every 5 years, or more frequently if you are over 19 years old. Skin check. Lung cancer screening. You may have this screening every year starting at age 20 if you have a 30-pack-year history of smoking and currently smoke or have quit within the past 15 years. Fecal occult blood test (FOBT) of the stool. You may have this test every year starting at age 60. Flexible sigmoidoscopy or colonoscopy. You may have a sigmoidoscopy every 5 years or a colonoscopy every 10 years starting at age 47. Hepatitis C blood test. Hepatitis B blood test. Sexually transmitted disease (STD) testing. Diabetes screening. This is done by checking your blood sugar (glucose) after you have not eaten for a while (fasting). You may have this done every 1-3 years. Bone density scan. This is done to screen for osteoporosis. You may have this done starting at age 49. Mammogram. This may be done every 1-2 years. Talk to your health care provider about how often you should have regular mammograms. Talk with your health care provider about your test results, treatment options, and if necessary, the need for more tests. Vaccines  Your health care provider may recommend certain vaccines, such as: Influenza vaccine. This is recommended every year. Tetanus, diphtheria, and acellular pertussis (Tdap, Td) vaccine. You may need a Td booster every 10 years. Zoster vaccine. You may need this after age 74. Pneumococcal 13-valent conjugate (PCV13) vaccine. One dose is recommended after age 22. Pneumococcal polysaccharide (PPSV23) vaccine. One dose is recommended after age 15. Talk to your health care provider about which screenings  and vaccines you need and how often you need them. This information is  not intended to replace advice given to you by your health care provider. Make sure you discuss any questions you have with your health care provider. Document Released: 03/18/2015 Document Revised: 11/09/2015 Document Reviewed: 12/21/2014 Elsevier Interactive Patient Education  2017 [place] Prevention in the Home Falls can cause injuries. They can happen to people of all ages. There are many things you can do to make your home safe and to help prevent falls. What can I do on the outside of my home? Regularly fix the edges of walkways and driveways and fix any cracks. Remove anything that might make you trip as you walk through a door, such as a raised step or threshold. Trim any bushes or trees on the path to your home. Use bright outdoor lighting. Clear any walking paths of anything that might make someone trip, such as rocks or tools. Regularly check to see if handrails are loose or broken. Make sure that both sides of any steps have handrails. Any raised decks and porches should have guardrails on the edges. Have any leaves, snow, or ice cleared regularly. Use sand or salt on walking paths during winter. Clean up any spills in your garage right away. This includes oil or grease spills. What can I do in the bathroom? Use night lights. Install grab bars by the toilet and in the tub and shower. Do not use towel bars as grab bars. Use non-skid mats or decals in the tub or shower. If you need to sit down in the shower, use a plastic, non-slip stool. Keep the floor dry. Clean up any water that spills on the floor as soon as it happens. Remove soap buildup in the tub or shower regularly. Attach bath mats securely with double-sided non-slip rug tape. Do not have throw rugs and other things on the floor that can make you trip. What can I do in the bedroom? Use night lights. Make sure that you have a light by your bed that is easy to reach. Do not use any sheets or blankets that  are too big for your bed. They should not hang down onto the floor. Have a firm chair that has side arms. You can use this for support while you get dressed. Do not have throw rugs and other things on the floor that can make you trip. What can I do in the kitchen? Clean up any spills right away. Avoid walking on wet floors. Keep items that you use a lot in easy-to-reach places. If you need to reach something above you, use a strong step stool that has a grab bar. Keep electrical cords out of the way. Do not use floor polish or wax that makes floors slippery. If you must use wax, use non-skid floor wax. Do not have throw rugs and other things on the floor that can make you trip. What can I do with my stairs? Do not leave any items on the stairs. Make sure that there are handrails on both sides of the stairs and use them. Fix handrails that are broken or loose. Make sure that handrails are as long as the stairways. Check any carpeting to make sure that it is firmly attached to the stairs. Fix any carpet that is loose or worn. Avoid having throw rugs at the top or bottom of the stairs. If you do have throw rugs, attach them to the floor with carpet tape.  Make sure that you have a light switch at the top of the stairs and the bottom of the stairs. If you do not have them, ask someone to add them for you. What else can I do to help prevent falls? Wear shoes that: Do not have high heels. Have rubber bottoms. Are comfortable and fit you well. Are closed at the toe. Do not wear sandals. If you use a stepladder: Make sure that it is fully opened. Do not climb a closed stepladder. Make sure that both sides of the stepladder are locked into place. Ask someone to hold it for you, if possible. Clearly mark and make sure that you can see: Any grab bars or handrails. First and last steps. Where the edge of each step is. Use tools that help you move around (mobility aids) if they are needed. These  include: Canes. Walkers. Scooters. Crutches. Turn on the lights when you go into a dark area. Replace any light bulbs as soon as they burn out. Set up your furniture so you have a clear path. Avoid moving your furniture around. If any of your floors are uneven, fix them. If there are any pets around you, be aware of where they are. Review your medicines with your doctor. Some medicines can make you feel dizzy. This can increase your chance of falling. Ask your doctor what other things that you can do to help prevent falls. This information is not intended to replace advice given to you by your health care provider. Make sure you discuss any questions you have with your health care provider. Document Released: 12/16/2008 Document Revised: 07/28/2015 Document Reviewed: 03/26/2014 Elsevier Interactive Patient Education  2017 [employer].

## 2021-08-28 ENCOUNTER — Ambulatory Visit (INDEPENDENT_AMBULATORY_CARE_PROVIDER_SITE_OTHER): Payer: Medicare HMO | Admitting: Family Medicine

## 2021-08-28 ENCOUNTER — Encounter: Payer: Self-pay | Admitting: Family Medicine

## 2021-08-28 VITALS — BP 128/80 | HR 67 | Temp 98.8°F | Resp 16 | Ht 63.0 in | Wt 180.0 lb

## 2021-08-28 DIAGNOSIS — R030 Elevated blood-pressure reading, without diagnosis of hypertension: Secondary | ICD-10-CM

## 2021-08-28 NOTE — Assessment & Plan Note (Signed)
BP back to normal today.  No need for medication.  Pt is very relieved by this.  Will continue to follow.

## 2021-12-12 ENCOUNTER — Encounter: Payer: Self-pay | Admitting: Family Medicine

## 2021-12-12 ENCOUNTER — Ambulatory Visit (INDEPENDENT_AMBULATORY_CARE_PROVIDER_SITE_OTHER): Payer: Medicare HMO | Admitting: Family Medicine

## 2021-12-12 VITALS — BP 118/70 | HR 73 | Temp 98.9°F | Resp 16 | Ht 63.0 in | Wt 177.4 lb

## 2021-12-12 DIAGNOSIS — E119 Type 2 diabetes mellitus without complications: Secondary | ICD-10-CM | POA: Diagnosis not present

## 2021-12-12 DIAGNOSIS — Z1231 Encounter for screening mammogram for malignant neoplasm of breast: Secondary | ICD-10-CM

## 2021-12-12 DIAGNOSIS — Z0000 Encounter for general adult medical examination without abnormal findings: Secondary | ICD-10-CM

## 2021-12-12 DIAGNOSIS — M810 Age-related osteoporosis without current pathological fracture: Secondary | ICD-10-CM | POA: Diagnosis not present

## 2021-12-12 LAB — MICROALBUMIN / CREATININE URINE RATIO
Creatinine,U: 49.3 mg/dL
Microalb Creat Ratio: 1.4 mg/g (ref 0.0–30.0)
Microalb, Ur: <0.7 mg/dL (ref 0.0–1.9)

## 2021-12-12 LAB — CBC WITH DIFFERENTIAL/PLATELET
Basophils Absolute: 0 10*3/uL (ref 0.0–0.1)
Basophils Relative: 0.6 % (ref 0.0–3.0)
Eosinophils Absolute: 0.3 10*3/uL (ref 0.0–0.7)
Eosinophils Relative: 4.3 % (ref 0.0–5.0)
HCT: 43.7 % (ref 36.0–46.0)
Hemoglobin: 15 g/dL (ref 12.0–15.0)
Lymphocytes Relative: 28.3 % (ref 12.0–46.0)
Lymphs Abs: 2.1 10*3/uL (ref 0.7–4.0)
MCHC: 34.4 g/dL (ref 30.0–36.0)
MCV: 93.5 fl (ref 78.0–100.0)
Monocytes Absolute: 0.7 10*3/uL (ref 0.1–1.0)
Monocytes Relative: 9.7 % (ref 3.0–12.0)
Neutro Abs: 4.2 10*3/uL (ref 1.4–7.7)
Neutrophils Relative %: 57.1 % (ref 43.0–77.0)
Platelets: 268 10*3/uL (ref 150.0–400.0)
RBC: 4.68 Mil/uL (ref 3.87–5.11)
RDW: 12.8 % (ref 11.5–15.5)
WBC: 7.3 10*3/uL (ref 4.0–10.5)

## 2021-12-12 LAB — HEPATIC FUNCTION PANEL
ALT: 23 U/L (ref 0–35)
AST: 17 U/L (ref 0–37)
Albumin: 4.2 g/dL (ref 3.5–5.2)
Alkaline Phosphatase: 46 U/L (ref 39–117)
Bilirubin, Direct: 0.2 mg/dL (ref 0.0–0.3)
Total Bilirubin: 1.1 mg/dL (ref 0.2–1.2)
Total Protein: 7.3 g/dL (ref 6.0–8.3)

## 2021-12-12 LAB — TSH: TSH: 3.88 u[IU]/mL (ref 0.35–5.50)

## 2021-12-12 LAB — BASIC METABOLIC PANEL
BUN: 14 mg/dL (ref 6–23)
CO2: 27 mEq/L (ref 19–32)
Calcium: 9.5 mg/dL (ref 8.4–10.5)
Chloride: 100 mEq/L (ref 96–112)
Creatinine, Ser: 0.8 mg/dL (ref 0.40–1.20)
GFR: 71.23 mL/min (ref >60.00)
Glucose, Bld: 144 mg/dL — ABNORMAL HIGH (ref 70–99)
Potassium: 4.1 mEq/L (ref 3.5–5.1)
Sodium: 137 mEq/L (ref 135–145)

## 2021-12-12 LAB — LIPID PANEL
Cholesterol: 143 mg/dL (ref 0–200)
HDL: 57.9 mg/dL (ref >39.00)
LDL Cholesterol: 51 mg/dL (ref 0–99)
NonHDL: 85.39
Total CHOL/HDL Ratio: 2
Triglycerides: 174 mg/dL — ABNORMAL HIGH (ref 0.0–149.0)
VLDL: 34.8 mg/dL (ref 0.0–40.0)

## 2021-12-12 LAB — HEMOGLOBIN A1C: Hgb A1c MFr Bld: 6.7 % — ABNORMAL HIGH (ref 4.6–6.5)

## 2021-12-12 NOTE — Assessment & Plan Note (Signed)
Chronic problem.  Pt has had GI issues since increasing her Metformin.  Will wait on A1C result to better adjust meds.  UTD on eye exam, foot exam.  Check microalbumin today.

## 2021-12-12 NOTE — Assessment & Plan Note (Signed)
Due for repeat DEXA- ordered.  Check Vit D level and replete prn.

## 2021-12-12 NOTE — Assessment & Plan Note (Signed)
Pt's PE WNL w/ exception of BMI.  DEXA and mammo ordered.  UTD on PNA.  Declines flu.  Check labs.  Anticipatory guidance provided.

## 2021-12-12 NOTE — Progress Notes (Signed)
Subjective:    Patient ID: Sandra Bailey, female    DOB: [birth year], 76 y.o.   MRN: [id number]  HPI CPE- due for DEXA and Mammogram.  UTD on PNA vaccines, eye exam, foot exam.  Needs microalbumin  Patient Care Team    Relationship Specialty Notifications Start End  Midge Minium, MD PCP - General Family Medicine  08/19/15   Faylene Million, MD Referring Physician Internal Medicine  08/19/15    Comment: cardiology  Harle Stanford, MD Referring Physician Urology  08/19/15   Victory Dakin, MD Referring Physician Optometry  08/19/15   Jasmine Awe, MD Referring Physician Ophthalmology  11/30/15   Sandford Craze, MD Referring Physician Dermatology  06/28/16      Health Maintenance  Topic Date Due   DEXA SCAN  01/25/2020   MAMMOGRAM  03/17/2020   Diabetic kidney evaluation - Urine ACR  11/29/2021   INFLUENZA VACCINE  06/04/2022 (Originally 10/03/2021)   HEMOGLOBIN A1C  02/01/2022   OPHTHALMOLOGY EXAM  03/24/2022   Diabetic kidney evaluation - GFR measurement  08/03/2022   FOOT EXAM  12/13/2022   Pneumonia Vaccine 75+ Years old  Completed   HPV VACCINES  Aged Out   TETANUS/TDAP  Discontinued   COVID-19 Vaccine  Discontinued   Hepatitis C Screening  Discontinued   Fecal DNA (Cologuard)  Discontinued   Zoster Vaccines- Shingrix  Discontinued      Review of Systems Patient reports no hearing changes, adenopathy,fever, weight change,  persistant/recurrent hoarseness , swallowing issues, chest pain, palpitations, edema, persistant/recurrent cough, hemoptysis, dyspnea (rest/exertional/paroxysmal nocturnal), gastrointestinal bleeding (melena, rectal bleeding), abdominal pain, significant heartburn, GU symptoms (dysuria, hematuria, incontinence), Gyn symptoms (abnormal  bleeding, pain),  syncope, focal weakness, memory loss, numbness & tingling, skin/hair/nail changes, abnormal bruising or bleeding, anxiety, or depression.   + vision changes- pt w/ macular degeneration +  bowel changes since increasing Metformin- loose stools    Objective:   Physical Exam General Appearance:    Alert, cooperative, no distress, appears stated age, obese  Head:    Normocephalic, without obvious abnormality, atraumatic  Eyes:    PERRL, conjunctiva/corneas clear, EOM's intact both eyes  Ears:    Normal TM's and external ear canals, both ears  Nose:   Nares normal, septum midline, mucosa normal, no drainage    or sinus tenderness  Throat:   Lips, mucosa, and tongue normal; teeth and gums normal  Neck:   Supple, symmetrical, trachea midline, no adenopathy;    Thyroid: no enlargement/tenderness/nodules  Back:     Symmetric, no curvature, ROM normal, no CVA tenderness  Lungs:     Clear to auscultation bilaterally, respirations unlabored  Chest Wall:    No tenderness or deformity   Heart:    Regular rate and rhythm, S1 and S2 normal, no murmur, rub   or gallop  Breast Exam:    Deferred to mammo  Abdomen:     Soft, non-tender, bowel sounds active all four quadrants,    no masses, no organomegaly  Genitalia:    Deferred  Rectal:    Extremities:   Extremities normal, atraumatic, no cyanosis or edema  Pulses:   2+ and symmetric all extremities  Skin:   Skin color, texture, turgor normal, no rashes or lesions  Lymph nodes:   Cervical, supraclavicular, and axillary nodes normal  Neurologic:   CNII-XII intact, normal strength, sensation and reflexes    throughout          Assessment & Plan:

## 2021-12-12 NOTE — Patient Instructions (Addendum)
Follow up in 3-4 months to recheck sugars We'll notify you of your lab results and make any changes if needed Continue to work on low carb/low sugar diet and regular exercise- you can do it!!! They should call you to schedule your mammogram and bone density Call with any questions or concerns Stay Safe!  Stay Healthy! Happy Fall!!!

## 2021-12-13 LAB — VITAMIN D 25 HYDROXY (VIT D DEFICIENCY, FRACTURES): VITD: 46.45 ng/mL (ref 30.00–100.00)

## 2021-12-14 NOTE — Progress Notes (Signed)
Informed the pt of her lab results

## 2022-01-03 ENCOUNTER — Other Ambulatory Visit: Payer: Self-pay | Admitting: Family Medicine

## 2022-01-07 ENCOUNTER — Other Ambulatory Visit: Payer: Self-pay | Admitting: Family Medicine

## 2022-01-07 DIAGNOSIS — E119 Type 2 diabetes mellitus without complications: Secondary | ICD-10-CM

## 2022-02-15 ENCOUNTER — Other Ambulatory Visit: Payer: Self-pay

## 2022-02-15 MED ORDER — NYSTATIN 100000 UNIT/GM EX CREA: TOPICAL_CREAM | CUTANEOUS | 3 refills | Status: DC

## 2022-02-15 NOTE — Telephone Encounter (Signed)
Patient is requesting a refill of the following medications: Requested Prescriptions   Pending Prescriptions Disp Refills   nystatin cream (MYCOSTATIN) 30 g 3    Sig: APPLY TO AFFECTED AREA TWICE A DAY    Date of patient request: 02/15/22 Last office visit: 12/12/21 Date of last refill: 11/29/21 Last refill amount: 30 g

## 2022-04-02 ENCOUNTER — Other Ambulatory Visit: Payer: Self-pay | Admitting: Family Medicine

## 2022-04-02 DIAGNOSIS — E119 Type 2 diabetes mellitus without complications: Secondary | ICD-10-CM

## 2022-04-10 ENCOUNTER — Encounter: Payer: Self-pay | Admitting: Family Medicine

## 2022-04-10 ENCOUNTER — Ambulatory Visit (INDEPENDENT_AMBULATORY_CARE_PROVIDER_SITE_OTHER): Payer: Medicare HMO | Admitting: Family Medicine

## 2022-04-10 DIAGNOSIS — E119 Type 2 diabetes mellitus without complications: Secondary | ICD-10-CM

## 2022-04-10 LAB — BASIC METABOLIC PANEL
BUN: 19 mg/dL (ref 6–23)
CO2: 24 mEq/L (ref 19–32)
Calcium: 9.5 mg/dL (ref 8.4–10.5)
Chloride: 101 mEq/L (ref 96–112)
Creatinine, Ser: 0.82 mg/dL (ref 0.40–1.20)
GFR: 68.99 mL/min (ref >60.00)
Glucose, Bld: 133 mg/dL — ABNORMAL HIGH (ref 70–99)
Potassium: 4.4 mEq/L (ref 3.5–5.1)
Sodium: 138 mEq/L (ref 135–145)

## 2022-04-10 LAB — HEMOGLOBIN A1C: Hgb A1c MFr Bld: 6.9 % — ABNORMAL HIGH (ref 4.6–6.5)

## 2022-04-10 NOTE — Assessment & Plan Note (Signed)
Ongoing issue for pt.  She reports poor dietary habits and limited physical activity this winter.  Hopes to improve as the weather warms.  UTD on eye exam, foot exam, microalbumin.  Tolerating Metformin w/o difficulty.  Check labs.  Adjust meds prn

## 2022-04-10 NOTE — Patient Instructions (Addendum)
Follow up in 3-4 months to recheck sugar We'll notify you of your lab results and make any changes if needed Continue to work on healthy diet and regular exercise- you can do it! Call with any questions or concerns Stay Safe!  Stay Healthy!

## 2022-04-10 NOTE — Progress Notes (Signed)
   Subjective:    Patient ID: Sandra Bailey, female    DOB: 1945-07-21, 77 y.o.   MRN: [id number]  HPI DM- chronic problem, on Metformin XR '500mg'$  BID.  UTD on foot exam, microalbumin.  Due for eye exam- pt reports she is scheduled to have a monthly eye exam 'for the rest of my life'.  Last A1C 6.7%.  Pt is not testing sugars at home.  No CP, SOB, HA's, abd pain, N/V, symptomatic lows.  Denies numbness/tingling of hands/feet.   Review of Systems For ROS see HPI     Objective:   Physical Exam Vitals reviewed.  Constitutional:      General: She is not in acute distress.    Appearance: Normal appearance. She is well-developed.  HENT:     Head: Normocephalic and atraumatic.  Eyes:     Conjunctiva/sclera: Conjunctivae normal.     Pupils: Pupils are equal, round, and reactive to light.  Neck:     Thyroid: No thyromegaly.  Cardiovascular:     Rate and Rhythm: Normal rate and regular rhythm.     Pulses: Normal pulses.     Heart sounds: Normal heart sounds. No murmur heard. Pulmonary:     Effort: Pulmonary effort is normal. No respiratory distress.     Breath sounds: Normal breath sounds.  Abdominal:     General: There is no distension.     Palpations: Abdomen is soft.     Tenderness: There is no abdominal tenderness.  Musculoskeletal:     Cervical back: Normal range of motion and neck supple.     Right lower leg: No edema.     Left lower leg: No edema.  Lymphadenopathy:     Cervical: No cervical adenopathy.  Skin:    General: Skin is warm and dry.  Neurological:     Mental Status: She is alert and oriented to person, place, and time.  Psychiatric:        Behavior: Behavior normal.           Assessment & Plan:

## 2022-04-11 ENCOUNTER — Telehealth: Payer: Self-pay | Admitting: Family Medicine

## 2022-04-11 ENCOUNTER — Telehealth: Payer: Self-pay

## 2022-04-11 NOTE — Telephone Encounter (Signed)
Caller name: Sontee Desena  On DPR?: Yes  Call back number: [phone] (mobile)  Provider they see: Midge Minium, MD  Reason for call: Patient called stating that  [facility] has not received patient orders for a mammogram and bone density. Please fax orders to [phone].

## 2022-04-11 NOTE — Telephone Encounter (Signed)
-----   Message from Midge Minium, MD sent at 04/11/2022  7:27 AM EST ----- Labs are stable and look good.  Please continue to work on a low carb/low sugar diet and regular physical activity.  No changes at this time

## 2022-04-11 NOTE — Telephone Encounter (Signed)
Orders have been faxed and pt was  given these orders in her hand yesterday at her visit as well by Dr Birdie Riddle

## 2022-04-11 NOTE — Telephone Encounter (Signed)
Informed pt of lab results  

## 2022-04-19 ENCOUNTER — Inpatient Hospital Stay
Admission: RE | Admit: 2022-04-19 | Discharge: 2022-04-19 | Disposition: A | Discharge status: Home / Self Care | Payer: Self-pay | Source: Ambulatory Visit | Attending: Family Medicine | Admitting: Family Medicine

## 2022-04-19 ENCOUNTER — Other Ambulatory Visit: Payer: Self-pay | Admitting: Family Medicine

## 2022-04-19 DIAGNOSIS — Z1231 Encounter for screening mammogram for malignant neoplasm of breast: Secondary | ICD-10-CM

## 2022-04-30 ENCOUNTER — Ambulatory Visit (HOSPITAL_COMMUNITY)
Admission: RE | Admit: 2022-04-30 | Discharge: 2022-04-30 | Disposition: A | Discharge status: Home / Self Care | Payer: Medicare HMO | Source: Ambulatory Visit | Attending: Family Medicine | Admitting: Family Medicine

## 2022-04-30 DIAGNOSIS — M810 Age-related osteoporosis without current pathological fracture: Secondary | ICD-10-CM | POA: Insufficient documentation

## 2022-04-30 DIAGNOSIS — Z1231 Encounter for screening mammogram for malignant neoplasm of breast: Secondary | ICD-10-CM | POA: Diagnosis present

## 2022-05-01 ENCOUNTER — Other Ambulatory Visit: Payer: Self-pay

## 2022-05-01 ENCOUNTER — Telehealth: Payer: Self-pay

## 2022-05-01 DIAGNOSIS — M810 Age-related osteoporosis without current pathological fracture: Secondary | ICD-10-CM

## 2022-05-01 MED ORDER — ALENDRONATE SODIUM 70 MG PO TABS: 70.0000 mg | ORAL_TABLET | ORAL | 3 refills | Status: DC

## 2022-05-01 NOTE — Telephone Encounter (Signed)
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  Based on this, I recommend you start Fosamax 79m weekly (#12, 3 refills) in addition to daily Calcium and Vit D.

## 2022-05-01 NOTE — Telephone Encounter (Signed)
Informed pt of bone density result. Sent in Fosamax to pharmacy

## 2022-05-02 ENCOUNTER — Other Ambulatory Visit (HOSPITAL_COMMUNITY): Payer: Self-pay | Admitting: Family Medicine

## 2022-05-02 DIAGNOSIS — R928 Other abnormal and inconclusive findings on diagnostic imaging of breast: Secondary | ICD-10-CM

## 2022-05-22 ENCOUNTER — Ambulatory Visit (HOSPITAL_COMMUNITY)
Admission: RE | Admit: 2022-05-22 | Discharge: 2022-05-22 | Disposition: A | Discharge status: Home / Self Care | Payer: Medicare HMO | Source: Ambulatory Visit | Attending: Family Medicine | Admitting: Family Medicine

## 2022-05-22 DIAGNOSIS — R928 Other abnormal and inconclusive findings on diagnostic imaging of breast: Secondary | ICD-10-CM

## 2022-06-28 ENCOUNTER — Ambulatory Visit (INDEPENDENT_AMBULATORY_CARE_PROVIDER_SITE_OTHER): Payer: Medicare HMO | Admitting: *Deleted

## 2022-06-28 DIAGNOSIS — Z0000 Encounter for general adult medical examination without abnormal findings: Secondary | ICD-10-CM | POA: Diagnosis not present

## 2022-06-28 NOTE — Patient Instructions (Signed)
Sandra Bailey , Thank you for taking time to come for your Medicare Wellness Visit. I appreciate your ongoing commitment to your health goals. Please review the following plan we discussed and let me know if I can assist you in the future.   Screening recommendations/referrals: Colonoscopy: no longer required Mammogram: up to date Bone Density: up to date Recommended yearly ophthalmology/optometry visit for glaucoma screening and checkup Recommended yearly dental visit for hygiene and checkup  Vaccinations: Influenza vaccine:  Pneumococcal vaccine: up to date Tdap vaccine: Education provided Shingles vaccine:           Preventive Care 65 Years and Older, Female Preventive care refers to lifestyle choices and visits with your health care provider that can promote health and wellness. What does preventive care include? A yearly physical exam. This is also called an annual well check. Dental exams once or twice a year. Routine eye exams. Ask your health care provider how often you should have your eyes checked. Personal lifestyle choices, including: Daily care of your teeth and gums. Regular physical activity. Eating a healthy diet. Avoiding tobacco and drug use. Limiting alcohol use. Practicing safe sex. Taking low-dose aspirin every day. Taking vitamin and mineral supplements as recommended by your health care provider. What happens during an annual well check? The services and screenings done by your health care provider during your annual well check will depend on your age, overall health, lifestyle risk factors, and family history of disease. Counseling  Your health care provider may ask you questions about your: Alcohol use. Tobacco use. Drug use. Emotional well-being. Home and relationship well-being. Sexual activity. Eating habits. History of falls. Memory and ability to understand (cognition). Work and work Astronomer. Reproductive health. Screening  You may  have the following tests or measurements: Height, weight, and BMI. Blood pressure. Lipid and cholesterol levels. These may be checked every 5 years, or more frequently if you are over 61 years old. Skin check. Lung cancer screening. You may have this screening every year starting at age 44 if you have a 30-pack-year history of smoking and currently smoke or have quit within the past 15 years. Fecal occult blood test (FOBT) of the stool. You may have this test every year starting at age 73. Flexible sigmoidoscopy or colonoscopy. You may have a sigmoidoscopy every 5 years or a colonoscopy every 10 years starting at age 42. Hepatitis C blood test. Hepatitis B blood test. Sexually transmitted disease (STD) testing. Diabetes screening. This is done by checking your blood sugar (glucose) after you have not eaten for a while (fasting). You may have this done every 1-3 years. Bone density scan. This is done to screen for osteoporosis. You may have this done starting at age 38. Mammogram. This may be done every 1-2 years. Talk to your health care provider about how often you should have regular mammograms. Talk with your health care provider about your test results, treatment options, and if necessary, the need for more tests. Vaccines  Your health care provider may recommend certain vaccines, such as: Influenza vaccine. This is recommended every year. Tetanus, diphtheria, and acellular pertussis (Tdap, Td) vaccine. You may need a Td booster every 10 years. Zoster vaccine. You may need this after age 45. Pneumococcal 13-valent conjugate (PCV13) vaccine. One dose is recommended after age 40. Pneumococcal polysaccharide (PPSV23) vaccine. One dose is recommended after age 22. Talk to your health care provider about which screenings and vaccines you need and how often you need them. This information  is not intended to replace advice given to you by your health care provider. Make sure you discuss any  questions you have with your health care provider. Document Released: 03/18/2015 Document Revised: 11/09/2015 Document Reviewed: 12/21/2014 Elsevier Interactive Patient Education  2017 [employer].  Fall Prevention in the Home Falls can cause injuries. They can happen to people of all ages. There are many things you can do to make your home safe and to help prevent falls. What can I do on the outside of my home? Regularly fix the edges of walkways and driveways and fix any cracks. Remove anything that might make you trip as you walk through a door, such as a raised step or threshold. Trim any bushes or trees on the path to your home. Use bright outdoor lighting. Clear any walking paths of anything that might make someone trip, such as rocks or tools. Regularly check to see if handrails are loose or broken. Make sure that both sides of any steps have handrails. Any raised decks and porches should have guardrails on the edges. Have any leaves, snow, or ice cleared regularly. Use sand or salt on walking paths during winter. Clean up any spills in your garage right away. This includes oil or grease spills. What can I do in the bathroom? Use night lights. Install grab bars by the toilet and in the tub and shower. Do not use towel bars as grab bars. Use non-skid mats or decals in the tub or shower. If you need to sit down in the shower, use a plastic, non-slip stool. Keep the floor dry. Clean up any water that spills on the floor as soon as it happens. Remove soap buildup in the tub or shower regularly. Attach bath mats securely with double-sided non-slip rug tape. Do not have throw rugs and other things on the floor that can make you trip. What can I do in the bedroom? Use night lights. Make sure that you have a light by your bed that is easy to reach. Do not use any sheets or blankets that are too big for your bed. They should not hang down onto the floor. Have a firm chair that has side  arms. You can use this for support while you get dressed. Do not have throw rugs and other things on the floor that can make you trip. What can I do in the kitchen? Clean up any spills right away. Avoid walking on wet floors. Keep items that you use a lot in easy-to-reach places. If you need to reach something above you, use a strong step stool that has a grab bar. Keep electrical cords out of the way. Do not use floor polish or wax that makes floors slippery. If you must use wax, use non-skid floor wax. Do not have throw rugs and other things on the floor that can make you trip. What can I do with my stairs? Do not leave any items on the stairs. Make sure that there are handrails on both sides of the stairs and use them. Fix handrails that are broken or loose. Make sure that handrails are as long as the stairways. Check any carpeting to make sure that it is firmly attached to the stairs. Fix any carpet that is loose or worn. Avoid having throw rugs at the top or bottom of the stairs. If you do have throw rugs, attach them to the floor with carpet tape. Make sure that you have a light switch at the top of  the stairs and the bottom of the stairs. If you do not have them, ask someone to add them for you. What else can I do to help prevent falls? Wear shoes that: Do not have high heels. Have rubber bottoms. Are comfortable and fit you well. Are closed at the toe. Do not wear sandals. If you use a stepladder: Make sure that it is fully opened. Do not climb a closed stepladder. Make sure that both sides of the stepladder are locked into place. Ask someone to hold it for you, if possible. Clearly mark and make sure that you can see: Any grab bars or handrails. First and last steps. Where the edge of each step is. Use tools that help you move around (mobility aids) if they are needed. These include: Canes. Walkers. Scooters. Crutches. Turn on the lights when you go into a dark area.  Replace any light bulbs as soon as they burn out. Set up your furniture so you have a clear path. Avoid moving your furniture around. If any of your floors are uneven, fix them. If there are any pets around you, be aware of where they are. Review your medicines with your doctor. Some medicines can make you feel dizzy. This can increase your chance of falling. Ask your doctor what other things that you can do to help prevent falls. This information is not intended to replace advice given to you by your health care provider. Make sure you discuss any questions you have with your health care provider. Document Released: 12/16/2008 Document Revised: 07/28/2015 Document Reviewed: 03/26/2014 Elsevier Interactive Patient Education  2017 [employer].

## 2022-06-28 NOTE — Progress Notes (Signed)
Subjective:   Sandra Bailey is a 77 y.o. female who presents for Medicare Annual (Subsequent) preventive examination.  I connected with  Lawrence Santiago on 06/28/22 by a telephone enabled telemedicine application and verified that I am speaking with the correct person using two identifiers.   I discussed the limitations of evaluation and management by telemedicine. The patient expressed understanding and agreed to proceed.  Patient location: home  Provider location: telephone/home     Review of Systems     Cardiac Risk Factors include: advanced age (>77men, >50 women);diabetes mellitus;obesity (BMI >30kg/m2);family history of premature cardiovascular disease     Objective:    There were no vitals filed for this visit. There is no height or weight on file to calculate BMI.     08/17/2021   11:04 AM 07/04/2020   11:19 AM 07/01/2019   11:23 AM 07/03/2017   10:32 AM 06/28/2016   10:03 AM  Advanced Directives  Does Patient Have a Medical Advance Directive? Yes No No No No  Type of Estate agent of [place];Living will      Copy of Healthcare Power of Attorney in Chart? No - copy requested      Would patient like information on creating a medical advance directive?  No - Patient declined Yes (MAU/Ambulatory/Procedural Areas - Information given) Yes (MAU/Ambulatory/Procedural Areas - Information given) Yes (MAU/Ambulatory/Procedural Areas - Information given)    Current Medications (verified) Outpatient Encounter Medications as of 06/28/2022  Medication Sig   alendronate (FOSAMAX) 70 MG tablet Take 1 tablet (70 mg total) by mouth every 7 (seven) days. Take with a full glass of water on an empty stomach.   Avacincaptad Pegol (IZERVAY) 2 MG/0.1ML SOLN by Intravitreal route.   Biotin w/ Vitamins C & E (HAIR/SKIN/NAILS PO) Take by mouth.   metFORMIN (GLUCOPHAGE-XR) 500 MG 24 hr tablet TAKE 1 TABLET BY MOUTH TWICE A DAY   Multiple Vitamins-Minerals (CENTRUM SILVER  50+WOMEN) TABS Take by mouth.   Multiple Vitamins-Minerals (PRESERVISION AREDS 2) CAPS Take 2 capsules by mouth daily.    nystatin cream (MYCOSTATIN) APPLY TO AFFECTED AREA TWICE A DAY   Triamcinolone Acetonide (NASACORT ALLERGY 24HR NA) Place into the nose.   White Petrolatum-Mineral Oil (ARTIFICIAL TEARS) ointment as needed.   No facility-administered encounter medications on file as of 06/28/2022.    Allergies (verified) Macrodantin [nitrofurantoin macrocrystal] and Silver sulfadiazine   History: Past Medical History:  Diagnosis Date   Diabetes mellitus without complication    diet controlled   Heart murmur    Kidney stones    Macular degeneration    UTI (lower urinary tract infection)    Past Surgical History:  Procedure Laterality Date   ABDOMINAL HYSTERECTOMY     cataracts     EYE SURGERY  10/28/2019   catarat surgery left eye   EYE SURGERY  10/19/2019   catarat surgery right eye   LITHOTRIPSY     Family History  Problem Relation Age of Onset   Macular degeneration Mother    Hypertension Father    Glaucoma Sister    Hypertension Sister    Heart disease Brother    Panic disorder Son    Social History   Socioeconomic History   Marital status: Married    Spouse name: Not on file   Number of children: 2   Years of education: Not on file   Highest education level: Not on file  Occupational History   Occupation: Retired     Comment: data  entry   Tobacco Use   Smoking status: Never   Smokeless tobacco: Never  Vaping Use   Vaping Use: Never used  Substance and Sexual Activity   Alcohol use: Yes   Drug use: No   Sexual activity: Not Currently  Other Topics Concern   Not on file  Social History Narrative   2 Sons    4 Grandchildren (girls)    Hobbies- Social research officer, government, spending time with cousin    Social Determinants of Corporate investment banker Strain: Low Risk  (06/28/2022)   Overall Financial Resource Strain (CARDIA)    Difficulty of Paying Living  Expenses: Not hard at all  Food Insecurity: No Food Insecurity (06/28/2022)   Hunger Vital Sign    Worried About Running Out of Food in the Last Year: Never true    Ran Out of Food in the Last Year: Never true  Transportation Needs: No Transportation Needs (06/28/2022)   PRAPARE - Administrator, Civil Service (Medical): No    Lack of Transportation (Non-Medical): No  Physical Activity: Insufficiently Active (06/28/2022)   Exercise Vital Sign    Days of Exercise per Week: 2 days    Minutes of Exercise per Session: 40 min  Stress: No Stress Concern Present (06/28/2022)   [employer] of Occupational Health - Occupational Stress Questionnaire    Feeling of Stress : Not at all  Social Connections: Moderately Integrated (06/28/2022)   Social Connection and Isolation Panel [NHANES]    Frequency of Communication with Friends and Family: Three times a week    Frequency of Social Gatherings with Friends and Family: More than three times a week    Attends Religious Services: Never    Database administrator or Organizations: Yes    Attends Engineer, structural: More than 4 times per year    Marital Status: Married    Tobacco Counseling Counseling given: Not Answered   Clinical Intake:  Pre-visit preparation completed: Yes  Pain : No/denies pain     Nutritional Risks: None Diabetes: Yes CBG done?: No Did pt. bring in CBG monitor from home?: No  How often do you need to have someone help you when you read instructions, pamphlets, or other written materials from your doctor or pharmacy?: 1 - Never  Diabetic?  Yes  Nutrition Risk Assessment:  Has the patient had any N/V/D within the last 2 months?  No  Does the patient have any non-healing wounds?  No  Has the patient had any unintentional weight loss or weight gain?  No   Diabetes:  Is the patient diabetic?  Yes  If diabetic, was a CBG obtained today?  No  Did the patient bring in their glucometer from  home?  No  How often do you monitor your CBG's? Only at office.   Financial Strains and Diabetes Management:  Are you having any financial strains with the device, your supplies or your medication? No .  Does the patient want to be seen by Chronic Care Management for management of their diabetes?  No  Would the patient like to be referred to a Nutritionist or for Diabetic Management?  No   Diabetic Exams:  Diabetic Eye Exam: Completed .  Pt has been advised about the importance in completing this exam. A referral has been placed today. Message sent to referral coordinator for scheduling purposes. Advised pt to expect a call from office referred to regarding appt.  Diabetic Foot Exam:  . Pt has  been advised about the importance in completing this exam.   Interpreter Needed?: No  Information entered by :: Remi Haggard LPN   Activities of Daily Living    06/28/2022   11:42 AM 12/12/2021   10:02 AM  In your present state of health, do you have any difficulty performing the following activities:  Hearing? 0 0  Vision? 0 1  Difficulty concentrating or making decisions? 0 0  Walking or climbing stairs? 0 0  Dressing or bathing? 0 0  Doing errands, shopping? 0 0  Preparing Food and eating ? N   Using the Toilet? N   In the past six months, have you accidently leaked urine? N   Do you have problems with loss of bowel control? N   Managing your Medications? N   Managing your Finances? N   Housekeeping or managing your Housekeeping? N     Patient Care Team: Sheliah Hatch, MD as PCP - General (Family Medicine) Osie Bond, MD as Referring Physician (Internal Medicine) Andi Hence, MD as Referring Physician (Urology) Velda Shell, MD as Referring Physician (Optometry) Cherly Hensen, Brendolyn Patty, MD as Referring Physician (Ophthalmology) Marcelino Duster, MD as Referring Physician (Dermatology)  Indicate any recent Medical Services you may have received from other than  [facility] providers in the past year (date may be approximate).     Assessment:   This is a routine wellness examination for [place].  Hearing/Vision screen Hearing Screening - Comments:: No trouble hearing Vision Screening - Comments:: Macular degeneration Up to date [facility]  Dietary issues and exercise activities discussed: Current Exercise Habits: Structured exercise class, Type of exercise: stretching;walking;strength training/weights, Time (Minutes): 30, Frequency (Times/Week): 2, Weekly Exercise (Minutes/Week): 60, Intensity: Mild   Goals Addressed             This Visit's Progress    Patient Stated       Continue current lifestyle       Depression Screen    06/28/2022   11:48 AM 04/10/2022    9:17 AM 12/12/2021   10:02 AM 08/17/2021   11:05 AM 08/17/2021   11:03 AM 08/02/2021   11:24 AM 08/02/2021   10:49 AM  PHQ 2/9 Scores  PHQ - 2 Score 0 0 0 0 0 0 0  PHQ- 9 Score 0 0 0   2 1    Fall Risk    06/28/2022   11:40 AM 04/10/2022    9:18 AM 12/12/2021   10:02 AM 08/17/2021   11:05 AM 03/31/2021    9:27 AM  Fall Risk   Falls in the past year? 0 0 0 0 1  Number falls in past yr: 0 0  0 0  Injury with Fall? 0 0  0 1  Risk for fall due to :  No Fall Risks No Fall Risks  History of fall(s)  Follow up Falls evaluation completed;Education provided;Falls prevention discussed Falls evaluation completed Falls evaluation completed Falls evaluation completed;Education provided Falls evaluation completed    FALL RISK PREVENTION PERTAINING TO THE HOME:  Any stairs in or around the home? No  If so, are there any without handrails? No  Home free of loose throw rugs in walkways, pet beds, electrical cords, etc? Yes  Adequate lighting in your home to reduce risk of falls? Yes   ASSISTIVE DEVICES UTILIZED TO PREVENT FALLS:  Life alert? No  Use of a cane, walker or w/c? No  Grab bars in the bathroom? Yes  Shower chair or  bench in shower? Yes  Elevated toilet seat or a handicapped  toilet? Yes   TIMED UP AND GO:  Was the test performed? No .    Cognitive Function:    07/03/2017   10:34 AM  MMSE - Mini Mental State Exam  Orientation to time 5  Orientation to Place 5  Registration 3  Attention/ Calculation 3  Recall 3  Language- name 2 objects 2  Language- repeat 1  Language- follow 3 step command 3  Language- read & follow direction 1  Write a sentence 1  Copy design 1  Total score 28        06/28/2022   11:44 AM 07/01/2019   11:25 AM  6CIT Screen  What Year? 0 points 0 points  What month? 0 points 0 points  What time? 0 points 0 points  Count back from 20 2 points 0 points  Months in reverse 0 points 0 points  Repeat phrase 2 points 0 points  Total Score 4 points 0 points    Immunizations Immunization History  Administered Date(s) Administered   Moderna SARS-COV2 Booster Vaccination 12/31/2019   Moderna Sars-Covid-2 Vaccination 05/17/2019, 06/17/2019   Pneumococcal Conjugate-13 11/30/2015   Pneumococcal Polysaccharide-23 07/03/2017    TDAP status: Due, Education has been provided regarding the importance of this vaccine. Advised may receive this vaccine at local pharmacy or Health Dept. Aware to provide a copy of the vaccination record if obtained from local pharmacy or Health Dept. Verbalized acceptance and understanding.  Flu Vaccine status: Declined, Education has been provided regarding the importance of this vaccine but patient still declined. Advised may receive this vaccine at local pharmacy or Health Dept. Aware to provide a copy of the vaccination record if obtained from local pharmacy or Health Dept. Verbalized acceptance and understanding.  Pneumococcal vaccine status: Up to date  Covid-19 vaccine status: Declined, Education has been provided regarding the importance of this vaccine but patient still declined. Advised may receive this vaccine at local pharmacy or Health Dept.or vaccine clinic. Aware to provide a copy of the  vaccination record if obtained from local pharmacy or Health Dept. Verbalized acceptance and understanding.  Qualifies for Shingles Vaccine? Yes   Zostavax completed No   Shingrix Completed?: No.    Education has been provided regarding the importance of this vaccine. Patient has been advised to call insurance company to determine out of pocket expense if they have not yet received this vaccine. Advised may also receive vaccine at local pharmacy or Health Dept. Verbalized acceptance and understanding.  Screening Tests Health Maintenance  Topic Date Due   INFLUENZA VACCINE  10/04/2022   HEMOGLOBIN A1C  10/09/2022   Diabetic kidney evaluation - Urine ACR  12/13/2022   FOOT EXAM  12/13/2022   OPHTHALMOLOGY EXAM  02/03/2023   Diabetic kidney evaluation - eGFR measurement  04/11/2023   MAMMOGRAM  05/01/2023   Medicare Annual Wellness (AWV)  06/28/2023   DEXA SCAN  04/30/2024   Pneumonia Vaccine 7+ Years old  Completed   HPV VACCINES  Aged Out   DTaP/Tdap/Td  Discontinued   COVID-19 Vaccine  Discontinued   Hepatitis C Screening  Discontinued   Fecal DNA (Cologuard)  Discontinued   Zoster Vaccines- Shingrix  Discontinued    Health Maintenance  There are no preventive care reminders to display for this patient.   Colorectal cancer screening: No longer required.   Mammogram status: Completed  . Repeat every year  Bone Density status: Completed  . Results reflect:  Bone density results: OSTEOPOROSIS. Repeat every 2 years.  Lung Cancer Screening: (Low Dose CT Chest recommended if Age 47-80 years, 30 pack-year currently smoking OR have quit w/in 15years.) does not qualify.   Lung Cancer Screening Referral:   Additional Screening:  Hepatitis C Screening: does not qualify; Completed 2022  Vision Screening: Recommended annual ophthalmology exams for early detection of glaucoma and other disorders of the eye. Is the patient up to date with their annual eye exam?  Yes  Who is the  provider or what is the name of the office in which the patient attends annual eye exams? [facility] If pt is not established with a provider, would they like to be referred to a provider to establish care? No .   Dental Screening: Recommended annual dental exams for proper oral hygiene  Community Resource Referral / Chronic Care Management: CRR required this visit?  No   CCM required this visit?  No      Plan:     I have personally reviewed and noted the following in the patient's chart:   Medical and social history Use of alcohol, tobacco or illicit drugs  Current medications and supplements including opioid prescriptions. Patient is not currently taking opioid prescriptions. Functional ability and status Nutritional status Physical activity Advanced directives List of other physicians Hospitalizations, surgeries, and ER visits in previous 12 months Vitals Screenings to include cognitive, depression, and falls Referrals and appointments  In addition, I have reviewed and discussed with patient certain preventive protocols, quality metrics, and best practice recommendations. A written personalized care plan for preventive services as well as general preventive health recommendations were provided to patient.     Remi Haggard, LPN   1/61/0960   Nurse Notes:

## 2022-07-25 ENCOUNTER — Ambulatory Visit: Payer: Medicare HMO | Admitting: Family Medicine

## 2022-08-01 ENCOUNTER — Encounter: Payer: Self-pay | Admitting: Family Medicine

## 2022-08-01 ENCOUNTER — Ambulatory Visit (INDEPENDENT_AMBULATORY_CARE_PROVIDER_SITE_OTHER): Payer: Medicare HMO | Admitting: Family Medicine

## 2022-08-01 VITALS — BP 128/82 | HR 73 | Temp 97.2°F | Resp 17 | Ht 63.0 in | Wt 177.4 lb

## 2022-08-01 DIAGNOSIS — Z7984 Long term (current) use of oral hypoglycemic drugs: Secondary | ICD-10-CM | POA: Diagnosis not present

## 2022-08-01 DIAGNOSIS — E119 Type 2 diabetes mellitus without complications: Secondary | ICD-10-CM

## 2022-08-01 DIAGNOSIS — M810 Age-related osteoporosis without current pathological fracture: Secondary | ICD-10-CM

## 2022-08-01 LAB — BASIC METABOLIC PANEL
BUN: 14 mg/dL (ref 6–23)
CO2: 28 mEq/L (ref 19–32)
Calcium: 9.6 mg/dL (ref 8.4–10.5)
Chloride: 100 mEq/L (ref 96–112)
Creatinine, Ser: 0.87 mg/dL (ref 0.40–1.20)
GFR: 64.12 mL/min (ref >60.00)
Glucose, Bld: 145 mg/dL — ABNORMAL HIGH (ref 70–99)
Potassium: 4.2 mEq/L (ref 3.5–5.1)
Sodium: 136 mEq/L (ref 135–145)

## 2022-08-01 LAB — CBC WITH DIFFERENTIAL/PLATELET
Basophils Absolute: 0 10*3/uL (ref 0.0–0.1)
Basophils Relative: 0.6 % (ref 0.0–3.0)
Eosinophils Absolute: 0.3 10*3/uL (ref 0.0–0.7)
Eosinophils Relative: 4.4 % (ref 0.0–5.0)
HCT: 43.6 % (ref 36.0–46.0)
Hemoglobin: 14.6 g/dL (ref 12.0–15.0)
Lymphocytes Relative: 30.3 % (ref 12.0–46.0)
Lymphs Abs: 2.2 10*3/uL (ref 0.7–4.0)
MCHC: 33.5 g/dL (ref 30.0–36.0)
MCV: 93.8 fl (ref 78.0–100.0)
Monocytes Absolute: 0.7 10*3/uL (ref 0.1–1.0)
Monocytes Relative: 9.8 % (ref 3.0–12.0)
Neutro Abs: 3.9 10*3/uL (ref 1.4–7.7)
Neutrophils Relative %: 54.9 % (ref 43.0–77.0)
Platelets: 290 10*3/uL (ref 150.0–400.0)
RBC: 4.64 Mil/uL (ref 3.87–5.11)
RDW: 13.1 % (ref 11.5–15.5)
WBC: 7.1 10*3/uL (ref 4.0–10.5)

## 2022-08-01 LAB — HEMOGLOBIN A1C: Hgb A1c MFr Bld: 7 % — ABNORMAL HIGH (ref 4.6–6.5)

## 2022-08-01 LAB — LIPID PANEL
Cholesterol: 146 mg/dL (ref 0–200)
HDL: 52.9 mg/dL (ref >39.00)
NonHDL: 92.79
Total CHOL/HDL Ratio: 3
Triglycerides: 236 mg/dL — ABNORMAL HIGH (ref 0.0–149.0)
VLDL: 47.2 mg/dL — ABNORMAL HIGH (ref 0.0–40.0)

## 2022-08-01 LAB — LDL CHOLESTEROL, DIRECT: Direct LDL: 80 mg/dL

## 2022-08-01 LAB — HEPATIC FUNCTION PANEL
ALT: 21 U/L (ref 0–35)
AST: 17 U/L (ref 0–37)
Albumin: 4.1 g/dL (ref 3.5–5.2)
Alkaline Phosphatase: 36 U/L — ABNORMAL LOW (ref 39–117)
Bilirubin, Direct: 0.1 mg/dL (ref 0.0–0.3)
Total Bilirubin: 0.8 mg/dL (ref 0.2–1.2)
Total Protein: 7.4 g/dL (ref 6.0–8.3)

## 2022-08-01 LAB — TSH: TSH: 3.37 u[IU]/mL (ref 0.35–5.50)

## 2022-08-01 NOTE — Patient Instructions (Addendum)
Schedule your complete physical for October We'll notify you of your lab results and make any changes if needed STOP the Fosamax START daily Calcium (at least 1200 units) and Vit D (at least 2000 units)- these can come in a combination pill or you can take them separately Continue to work on healthy diet and regular physical activity- you can do it! Call with any questions or concerns Stay Safe!  Stay Healthy! Have a great summer!!!

## 2022-08-01 NOTE — Progress Notes (Unsigned)
   Subjective:    Patient ID: Sandra Bailey, female    DOB: 09-03-1945, 77 y.o.   MRN: [id number]  HPI DM- chronic problem.  On Metformin XR 500mg  BID.  Last A1C 6.9%  UTD on eye exam, foot exam, microalbumin.  Denies CP, SOB, HA's, abd pain, N/V.  No numbness/tingling of hands/feet.  Denies symptomatic lows.  Osteoporosis- currently on Fosamax 70mg  weekly.  Pt reports it will constipate her and cause her some GI issues.   Review of Systems For ROS see HPI     Objective:   Physical Exam Vitals reviewed.  Constitutional:      General: She is not in acute distress.    Appearance: Normal appearance. She is well-developed. She is not ill-appearing.  HENT:     Head: Normocephalic and atraumatic.  Eyes:     Conjunctiva/sclera: Conjunctivae normal.     Pupils: Pupils are equal, round, and reactive to light.  Neck:     Thyroid: No thyromegaly.  Cardiovascular:     Rate and Rhythm: Normal rate and regular rhythm.     Pulses: Normal pulses.     Heart sounds: Normal heart sounds. No murmur heard. Pulmonary:     Effort: Pulmonary effort is normal. No respiratory distress.     Breath sounds: Normal breath sounds.  Abdominal:     General: There is no distension.     Palpations: Abdomen is soft.     Tenderness: There is no abdominal tenderness.  Musculoskeletal:     Cervical back: Normal range of motion and neck supple.     Right lower leg: No edema.     Left lower leg: No edema.  Lymphadenopathy:     Cervical: No cervical adenopathy.  Skin:    General: Skin is warm and dry.  Neurological:     Mental Status: She is alert and oriented to person, place, and time.  Psychiatric:        Behavior: Behavior normal.           Assessment & Plan:

## 2022-08-02 ENCOUNTER — Telehealth: Payer: Self-pay

## 2022-08-02 NOTE — Telephone Encounter (Signed)
-----   Message from Sheliah Hatch, MD sent at 08/02/2022  7:28 AM EDT ----- Labs are stable and look good.  Continue to work on low carb/low sugar diet and regular physical activity.  No med changes at this time

## 2022-08-02 NOTE — Assessment & Plan Note (Signed)
Chronic problem.  Last A1C was good at 6.9% but pt has hx of labile sugars (typically diet dependent).  Tolerating Metformin w/o difficulty.  Check labs.  Adjust meds prn

## 2022-08-02 NOTE — Telephone Encounter (Signed)
Left pt a Vm  With results

## 2022-08-02 NOTE — Assessment & Plan Note (Signed)
Reviewed recent DEXA w/ pt.  She is just inside that osteoporosis range and we can stop the Fosamax if it's causing her problems.  That is what she would prefer.  Will start daily Calcium and Vit D instead.  Pt expressed understanding and is in agreement w/ plan.

## 2022-12-22 ENCOUNTER — Other Ambulatory Visit: Payer: Self-pay | Admitting: Family Medicine

## 2022-12-22 DIAGNOSIS — E119 Type 2 diabetes mellitus without complications: Secondary | ICD-10-CM

## 2023-03-05 LAB — HM DIABETES EYE EXAM

## 2023-03-07 ENCOUNTER — Telehealth: Payer: Self-pay

## 2023-03-07 ENCOUNTER — Other Ambulatory Visit: Payer: Self-pay | Admitting: Family Medicine

## 2023-03-07 DIAGNOSIS — E119 Type 2 diabetes mellitus without complications: Secondary | ICD-10-CM

## 2023-03-07 MED ORDER — METFORMIN HCL ER 500 MG PO TB24: ORAL_TABLET | ORAL | 1 refills | Status: DC

## 2023-03-07 NOTE — Telephone Encounter (Signed)
 Patient Metformin was sent to pharmacy with note to stop all other Rx directions, (should be one daily not two)   Admin pool: Patient is past due for appt please schedule her one

## 2023-03-07 NOTE — Telephone Encounter (Signed)
 Copied from CRM [id number]. Topic: Clinical - Prescription Issue >> Mar 07, 2023  1:43 PM Sandra Bailey wrote: Reason for CRM: Pt called stating she can not find her metformin  and it has been mixed up for a while at the pharmacy. Pt stated she was taken one and then it switched to her taken two and the pharmacy kept sending both bottles at  different times. Pt stated the pharmacy has not fixed the issue and she does not know if the other script has expired or not. Pt would also like to know If she need to make an appointment

## 2023-03-08 NOTE — Telephone Encounter (Deleted)
Pt returned call and he'll call us to schedule appt when he gets out of nursing facility. As well as medication was called in by [employer]

## 2023-03-13 ENCOUNTER — Ambulatory Visit: Payer: Medicare HMO | Admitting: Family Medicine

## 2023-03-13 ENCOUNTER — Encounter: Payer: Self-pay | Admitting: Family Medicine

## 2023-03-13 DIAGNOSIS — Z7984 Long term (current) use of oral hypoglycemic drugs: Secondary | ICD-10-CM

## 2023-03-13 DIAGNOSIS — E119 Type 2 diabetes mellitus without complications: Secondary | ICD-10-CM

## 2023-03-13 LAB — LIPID PANEL
Cholesterol: 169 mg/dL (ref 0–200)
HDL: 54.1 mg/dL (ref >39.00)
LDL Cholesterol: 73 mg/dL (ref 0–99)
NonHDL: 115.39
Total CHOL/HDL Ratio: 3
Triglycerides: 211 mg/dL — ABNORMAL HIGH (ref 0.0–149.0)
VLDL: 42.2 mg/dL — ABNORMAL HIGH (ref 0.0–40.0)

## 2023-03-13 LAB — TSH: TSH: 3.96 u[IU]/mL (ref 0.35–5.50)

## 2023-03-13 LAB — HEPATIC FUNCTION PANEL
ALT: 20 U/L (ref 0–35)
AST: 17 U/L (ref 0–37)
Albumin: 4.2 g/dL (ref 3.5–5.2)
Alkaline Phosphatase: 43 U/L (ref 39–117)
Bilirubin, Direct: 0.2 mg/dL (ref 0.0–0.3)
Total Bilirubin: 1.2 mg/dL (ref 0.2–1.2)
Total Protein: 7.4 g/dL (ref 6.0–8.3)

## 2023-03-13 LAB — CBC WITH DIFFERENTIAL/PLATELET
Basophils Absolute: 0.1 10*3/uL (ref 0.0–0.1)
Basophils Relative: 0.7 % (ref 0.0–3.0)
Eosinophils Absolute: 0.4 10*3/uL (ref 0.0–0.7)
Eosinophils Relative: 4.6 % (ref 0.0–5.0)
HCT: 45 % (ref 36.0–46.0)
Hemoglobin: 15.2 g/dL — ABNORMAL HIGH (ref 12.0–15.0)
Lymphocytes Relative: 30.9 % (ref 12.0–46.0)
Lymphs Abs: 2.4 10*3/uL (ref 0.7–4.0)
MCHC: 33.7 g/dL (ref 30.0–36.0)
MCV: 93.9 fL (ref 78.0–100.0)
Monocytes Absolute: 0.7 10*3/uL (ref 0.1–1.0)
Monocytes Relative: 9.1 % (ref 3.0–12.0)
Neutro Abs: 4.3 10*3/uL (ref 1.4–7.7)
Neutrophils Relative %: 54.7 % (ref 43.0–77.0)
Platelets: 275 10*3/uL (ref 150.0–400.0)
RBC: 4.79 Mil/uL (ref 3.87–5.11)
RDW: 13.2 % (ref 11.5–15.5)
WBC: 7.8 10*3/uL (ref 4.0–10.5)

## 2023-03-13 LAB — BASIC METABOLIC PANEL
BUN: 14 mg/dL (ref 6–23)
CO2: 28 meq/L (ref 19–32)
Calcium: 9.5 mg/dL (ref 8.4–10.5)
Chloride: 101 meq/L (ref 96–112)
Creatinine, Ser: 0.77 mg/dL (ref 0.40–1.20)
GFR: 73.92 mL/min (ref >60.00)
Glucose, Bld: 163 mg/dL — ABNORMAL HIGH (ref 70–99)
Potassium: 4.6 meq/L (ref 3.5–5.1)
Sodium: 137 meq/L (ref 135–145)

## 2023-03-13 LAB — HEMOGLOBIN A1C: Hgb A1c MFr Bld: 7.4 % — ABNORMAL HIGH (ref 4.6–6.5)

## 2023-03-13 NOTE — Patient Instructions (Signed)
 Schedule your complete physical in 3-4 months We'll notify you of your lab results and make any changes if needed Keep up the good work on healthy diet and regular physical activity- you look great! Call with any questions or concerns Stay Safe!  Stay Healthy! Happy New Year!!

## 2023-03-13 NOTE — Progress Notes (Signed)
   Subjective:    Patient ID: Sandra Bailey, female    DOB: 1945-03-26, 78 y.o.   MRN: [id number]  HPI DM- chronic problem.  Is supposed to be on Metformin  XR 500mg  daily but has been out of medication x2 weeks.  Due for A1C, foot exam, microalbumin.  UTD on eye exam.  Pt reports feeling 'real good'.  Denies CP, SOB, HA's, abd pain, N/V.  No numbness/tingling of hands/feet.   Review of Systems For ROS see HPI     Objective:   Physical Exam Vitals reviewed.  Constitutional:      General: She is not in acute distress.    Appearance: Normal appearance. She is well-developed. She is not ill-appearing.  HENT:     Head: Normocephalic and atraumatic.  Eyes:     Conjunctiva/sclera: Conjunctivae normal.     Pupils: Pupils are equal, round, and reactive to light.  Neck:     Thyroid : No thyromegaly.  Cardiovascular:     Rate and Rhythm: Normal rate and regular rhythm.     Pulses: Normal pulses.     Heart sounds: Normal heart sounds. No murmur heard. Pulmonary:     Effort: Pulmonary effort is normal. No respiratory distress.     Breath sounds: Normal breath sounds.  Abdominal:     General: There is no distension.     Palpations: Abdomen is soft.     Tenderness: There is no abdominal tenderness.  Musculoskeletal:     Cervical back: Normal range of motion and neck supple.     Right lower leg: No edema.     Left lower leg: No edema.  Lymphadenopathy:     Cervical: No cervical adenopathy.  Skin:    General: Skin is warm and dry.  Neurological:     General: No focal deficit present.     Mental Status: She is alert and oriented to person, place, and time.  Psychiatric:        Mood and Affect: Mood normal.        Behavior: Behavior normal.        Thought Content: Thought content normal.           Assessment & Plan:

## 2023-03-13 NOTE — Assessment & Plan Note (Signed)
 Chronic problem. Has been out of Metformin  XR 500mg  x2 weeks.  UTD on eye exam.  Foot exam done today.  Will get microalbumin at next visit as pt used the restroom just prior to appt and is unable to provide sample.  She is currently asymptomatic.  Restart Metformin .  Continue to monitor.

## 2023-03-14 ENCOUNTER — Telehealth: Payer: Self-pay

## 2023-03-14 NOTE — Telephone Encounter (Signed)
 Pt has been notified.

## 2023-03-14 NOTE — Telephone Encounter (Signed)
-----   Message from Comer Greet sent at 03/14/2023  7:36 AM EST ----- Labs look good!  A1C has increased from 7 --> 7.4%.  This is likely due to holiday eating.  Now that the holidays are over, try and get back on track with a low carb/low sugar diet.  No changes at this time

## 2023-03-15 ENCOUNTER — Ambulatory Visit: Payer: Medicare HMO

## 2023-03-15 LAB — MICROALBUMIN / CREATININE URINE RATIO
Creatinine,U: 22.8 mg/dL
Microalb Creat Ratio: 3.1 mg/g (ref 0.0–30.0)
Microalb, Ur: <0.7 mg/dL (ref 0.0–1.9)

## 2023-03-20 ENCOUNTER — Ambulatory Visit: Payer: Medicare HMO | Admitting: Family Medicine

## 2023-04-08 ENCOUNTER — Other Ambulatory Visit: Payer: Self-pay | Admitting: Family Medicine

## 2023-04-08 ENCOUNTER — Telehealth: Payer: Self-pay | Admitting: Family Medicine

## 2023-04-08 DIAGNOSIS — E119 Type 2 diabetes mellitus without complications: Secondary | ICD-10-CM

## 2023-04-08 NOTE — Telephone Encounter (Signed)
Copied from CRM [id number]. Topic: Clinical - Medication Refill >> Apr 08, 2023  4:25 PM Almira Coaster wrote: Most Recent Primary Care Visit:  Provider: [facility]  Department: [facility]  Visit Type: NURSE VISIT  Date: 03/15/2023  Medication: nystatin cream 100000 ups per gram  Has the patient contacted their pharmacy? Yes, they asked to contact providers office. (Agent: If no, request that the patient contact the pharmacy for the refill. If patient does not wish to contact the pharmacy document the reason why and proceed with request.) (Agent: If yes, when and what did the pharmacy advise?)  Is this the correct pharmacy for this prescription? Yes If no, delete pharmacy and type the correct one.  This is the patient's preferred pharmacy:  [id number] - [place], [place] - [address]  Phone: [phone]   Has the prescription been filled recently? No  Is the patient out of the medication? Yes  Has the patient been seen for an appointment in the last year OR does the patient have an upcoming appointment? Yes  Can we respond through MyChart? No  Agent: Please be advised that Rx refills may take up to 3 business days. We ask that you follow-up with your pharmacy.

## 2023-04-08 NOTE — Telephone Encounter (Signed)
Copied from CRM [id number]. Topic: Clinical - Medication Refill >> Apr 08, 2023  4:29 PM Almira Coaster wrote: Most Recent Primary Care Visit:  Provider: [facility]  Department: [facility]  Visit Type: NURSE VISIT  Date: 03/15/2023  Medication: metFORMIN (GLUCOPHAGE-XR) 500 MG 24 hr tablet  Has the patient contacted their pharmacy? Yes, they will need a new prescription (Agent: If no, request that the patient contact the pharmacy for the refill. If patient does not wish to contact the pharmacy document the reason why and proceed with request.) (Agent: If yes, when and what did the pharmacy advise?)  Is this the correct pharmacy for this prescription? Yes If no, delete pharmacy and type the correct one.  This is the patient's preferred pharmacy:    [id number] [place], [place] - [address] [facility] [address] [place] [address] Phone: [phone] Fax: [phone]   Has the prescription been filled recently? No  Is the patient out of the medication? No  Has the patient been seen for an appointment in the last year OR does the patient have an upcoming appointment? Yes  Can we respond through MyChart? No  Agent: Please be advised that Rx refills may take up to 3 business days. We ask that you follow-up with your pharmacy.

## 2023-04-08 NOTE — Telephone Encounter (Signed)
Last Fill: 03/07/23  Last OV: 03/13/23 Next OV: 06/05/23  Routing to provider for review/authorization.

## 2023-04-08 NOTE — Telephone Encounter (Signed)
Last Fill: Unknown  Last OV: 03/13/23 Next OV: 06/05/23  Routing to provider for review/authorization.

## 2023-04-09 ENCOUNTER — Other Ambulatory Visit: Payer: Self-pay

## 2023-04-09 MED ORDER — NYSTATIN 100000 UNIT/GM EX CREA: TOPICAL_CREAM | CUTANEOUS | 3 refills | Status: AC

## 2023-04-09 NOTE — Telephone Encounter (Signed)
Ok to refill for pt

## 2023-04-09 NOTE — Telephone Encounter (Signed)
 Pt has been notified.

## 2023-06-05 ENCOUNTER — Ambulatory Visit (INDEPENDENT_AMBULATORY_CARE_PROVIDER_SITE_OTHER): Payer: Medicare HMO | Admitting: Family Medicine

## 2023-06-05 ENCOUNTER — Encounter: Payer: Self-pay | Admitting: Family Medicine

## 2023-06-05 VITALS — BP 138/78 | HR 69 | Temp 98.2°F | Wt 181.0 lb

## 2023-06-05 DIAGNOSIS — E119 Type 2 diabetes mellitus without complications: Secondary | ICD-10-CM

## 2023-06-05 DIAGNOSIS — M810 Age-related osteoporosis without current pathological fracture: Secondary | ICD-10-CM | POA: Diagnosis not present

## 2023-06-05 DIAGNOSIS — Z0000 Encounter for general adult medical examination without abnormal findings: Secondary | ICD-10-CM | POA: Diagnosis not present

## 2023-06-05 DIAGNOSIS — Z7984 Long term (current) use of oral hypoglycemic drugs: Secondary | ICD-10-CM

## 2023-06-05 LAB — CBC WITH DIFFERENTIAL/PLATELET
Basophils Absolute: 0.1 10*3/uL (ref 0.0–0.1)
Basophils Relative: 0.8 % (ref 0.0–3.0)
Eosinophils Absolute: 0.4 10*3/uL (ref 0.0–0.7)
Eosinophils Relative: 5.2 % — ABNORMAL HIGH (ref 0.0–5.0)
HCT: 44 % (ref 36.0–46.0)
Hemoglobin: 14.9 g/dL (ref 12.0–15.0)
Lymphocytes Relative: 28.6 % (ref 12.0–46.0)
Lymphs Abs: 2.1 10*3/uL (ref 0.7–4.0)
MCHC: 33.8 g/dL (ref 30.0–36.0)
MCV: 93.8 fl (ref 78.0–100.0)
Monocytes Absolute: 0.6 10*3/uL (ref 0.1–1.0)
Monocytes Relative: 7.9 % (ref 3.0–12.0)
Neutro Abs: 4.2 10*3/uL (ref 1.4–7.7)
Neutrophils Relative %: 57.5 % (ref 43.0–77.0)
Platelets: 280 10*3/uL (ref 150.0–400.0)
RBC: 4.69 Mil/uL (ref 3.87–5.11)
RDW: 13.1 % (ref 11.5–15.5)
WBC: 7.4 10*3/uL (ref 4.0–10.5)

## 2023-06-05 LAB — LIPID PANEL
Cholesterol: 136 mg/dL (ref 0–200)
HDL: 54.8 mg/dL (ref >39.00)
LDL Cholesterol: 51 mg/dL (ref 0–99)
NonHDL: 80.84
Total CHOL/HDL Ratio: 2
Triglycerides: 148 mg/dL (ref 0.0–149.0)
VLDL: 29.6 mg/dL (ref 0.0–40.0)

## 2023-06-05 LAB — HEMOGLOBIN A1C: Hgb A1c MFr Bld: 7.6 % — ABNORMAL HIGH (ref 4.6–6.5)

## 2023-06-05 LAB — BASIC METABOLIC PANEL WITH GFR
BUN: 12 mg/dL (ref 6–23)
CO2: 28 meq/L (ref 19–32)
Calcium: 9.2 mg/dL (ref 8.4–10.5)
Chloride: 101 meq/L (ref 96–112)
Creatinine, Ser: 0.73 mg/dL (ref 0.40–1.20)
GFR: 78.68 mL/min (ref >60.00)
Glucose, Bld: 169 mg/dL — ABNORMAL HIGH (ref 70–99)
Potassium: 4.1 meq/L (ref 3.5–5.1)
Sodium: 137 meq/L (ref 135–145)

## 2023-06-05 LAB — HEPATIC FUNCTION PANEL
ALT: 20 U/L (ref 0–35)
AST: 16 U/L (ref 0–37)
Albumin: 4.2 g/dL (ref 3.5–5.2)
Alkaline Phosphatase: 49 U/L (ref 39–117)
Bilirubin, Direct: 0.2 mg/dL (ref 0.0–0.3)
Total Bilirubin: 1 mg/dL (ref 0.2–1.2)
Total Protein: 7.2 g/dL (ref 6.0–8.3)

## 2023-06-05 LAB — VITAMIN D 25 HYDROXY (VIT D DEFICIENCY, FRACTURES): VITD: 50.22 ng/mL (ref 30.00–100.00)

## 2023-06-05 LAB — TSH: TSH: 3.12 u[IU]/mL (ref 0.35–5.50)

## 2023-06-05 MED ORDER — METFORMIN HCL ER 500 MG PO TB24: ORAL_TABLET | ORAL | 1 refills | Status: DC

## 2023-06-05 NOTE — Assessment & Plan Note (Signed)
 Chronic problem.  On Metformin w/o difficulty.  UTD on eye exam, microalbumin, foot exam.  Check labs.  Adjust meds prn

## 2023-06-05 NOTE — Assessment & Plan Note (Signed)
UTD on DEXA.  Check Vit D and replete prn. 

## 2023-06-05 NOTE — Patient Instructions (Signed)
 Follow up in 6 months to recheck BP, cholesterol, hyperlipidemia We'll notify you of your lab results and make any changes if needed Continue to work on healthy diet and regular exercise- you can do it! Schedule your mammogram at your convenience Call with any questions or concerns Stay Safe!  Stay Healthy! Happy Spring!!

## 2023-06-05 NOTE — Progress Notes (Signed)
 Subjective:    Patient ID: Sandra Bailey, female    DOB: 1945/09/10, 78 y.o.   MRN: [id number]  HPI CPE- Pt reports she is UTD on eye exam.  She is 78 yrs old and no longer needs colon cancer screening.  Plans to schedule mammo  Patient Care Team    Relationship Specialty Notifications Start End  Sheliah Hatch, MD PCP - General Family Medicine  08/19/15   Osie Bond, MD Referring Physician Internal Medicine  08/19/15    Comment: cardiology  Andi Hence, MD Referring Physician Urology  08/19/15   Velda Shell, MD Referring Physician Optometry  08/19/15   Beverely Pace, MD Referring Physician Ophthalmology  11/30/15   Marcelino Duster, MD Referring Physician Dermatology  06/28/16     Health Maintenance  Topic Date Due   Hepatitis C Screening  Never done   DTaP/Tdap/Td (1 - Tdap) Never done   Zoster Vaccines- Shingrix (1 of 2) Never done   COVID-19 Vaccine (4 - 2024-25 season) 11/04/2022   OPHTHALMOLOGY EXAM  02/03/2023   Fecal DNA (Cologuard)  03/12/2023   MAMMOGRAM  05/01/2023   Medicare Annual Wellness (AWV)  06/28/2023   HEMOGLOBIN A1C  09/10/2023   INFLUENZA VACCINE  10/04/2023   Diabetic kidney evaluation - eGFR measurement  03/12/2024   FOOT EXAM  03/12/2024   Diabetic kidney evaluation - Urine ACR  03/14/2024   DEXA SCAN  04/30/2024   Pneumonia Vaccine 44+ Years old  Completed   HPV VACCINES  Aged Out      Review of Systems Patient reports no vision/ hearing changes, adenopathy,fever, weight change,  persistant/recurrent hoarseness , swallowing issues, chest pain, palpitations, edema, persistant/recurrent cough, hemoptysis, dyspnea (rest/exertional/paroxysmal nocturnal), gastrointestinal bleeding (melena, rectal bleeding), abdominal pain, significant heartburn, bowel changes, GU symptoms (dysuria, hematuria, incontinence), Gyn symptoms (abnormal  bleeding, pain),  syncope, focal weakness, memory loss, numbness & tingling, skin/hair/nail changes,  abnormal bruising or bleeding, anxiety, or depression.     Objective:   Physical Exam General Appearance:    Alert, cooperative, no distress, appears stated age  Head:    Normocephalic, without obvious abnormality, atraumatic  Eyes:    PERRL, conjunctiva/corneas clear, EOM's intact both eyes  Ears:    Normal TM's and external ear canals, both ears  Nose:   Nares normal, septum midline, mucosa normal, no drainage    or sinus tenderness  Throat:   Lips, mucosa, and tongue normal; teeth and gums normal  Neck:   Supple, symmetrical, trachea midline, no adenopathy;    Thyroid: no enlargement/tenderness/nodules  Back:     Symmetric, no curvature, ROM normal, no CVA tenderness  Lungs:     Clear to auscultation bilaterally, respirations unlabored  Chest Wall:    No tenderness or deformity   Heart:    Regular rate and rhythm, S1 and S2 normal, no murmur, rub   or gallop  Breast Exam:    Deferred to mammo  Abdomen:     Soft, non-tender, bowel sounds active all four quadrants,    no masses, no organomegaly  Genitalia:    Deferred  Rectal:    Extremities:   Extremities normal, atraumatic, no cyanosis or edema  Pulses:   2+ and symmetric all extremities  Skin:   Skin color, texture, turgor normal, no rashes or lesions  Lymph nodes:   Cervical, supraclavicular, and axillary nodes normal  Neurologic:   CNII-XII intact, normal strength, sensation and reflexes    throughout  Assessment & Plan:

## 2023-06-05 NOTE — Assessment & Plan Note (Signed)
 Pt's PE WNL w/ exception of BMI.  No longer needs colon cancer screening.  Plans to schedule mammo.  Check labs.  Anticipatory guidance provided.

## 2023-06-06 ENCOUNTER — Encounter: Payer: Self-pay | Admitting: Family Medicine

## 2023-06-06 NOTE — Telephone Encounter (Signed)
-----   Message from Neena Rhymes sent at 06/06/2023  7:37 AM EDT ----- Labs are stable and overall look good.  Please be mindful of your carb intake and regular physical activity to keep your A1C and sugar in range.  No changes at this time

## 2023-06-06 NOTE — Telephone Encounter (Signed)
 Lab results have been discussed.   Verbalized understanding? Yes  Are there any questions? No

## 2023-07-02 ENCOUNTER — Encounter: Payer: Medicare HMO | Admitting: Family Medicine

## 2023-08-13 ENCOUNTER — Ambulatory Visit (INDEPENDENT_AMBULATORY_CARE_PROVIDER_SITE_OTHER): Admitting: *Deleted

## 2023-08-13 DIAGNOSIS — Z0000 Encounter for general adult medical examination without abnormal findings: Secondary | ICD-10-CM

## 2023-08-13 NOTE — Progress Notes (Signed)
 Subjective:   Sandra Bailey is a 78 y.o. female who presents for Medicare Annual (Subsequent) preventive examination.  Visit Complete: Virtual I connected with  Serina Dane on 08/13/23 by a audio enabled telemedicine application and verified that I am speaking with the correct person using two identifiers.  Patient Location: Home  Provider Location: Home Office  I discussed the limitations of evaluation and management by telemedicine. The patient expressed understanding and agreed to proceed.  Vital Signs: Because this visit was a virtual/telehealth visit, some criteria may be missing or patient reported. Any vitals not documented were not able to be obtained and vitals that have been documented are patient reported.  Cardiac Risk Factors include: advanced age (>59men, >74 women);diabetes mellitus;family history of premature cardiovascular disease     Objective:     There were no vitals filed for this visit. There is no height or weight on file to calculate BMI.     08/13/2023   11:43 AM 08/17/2021   11:04 AM 07/04/2020   11:19 AM 07/01/2019   11:23 AM 07/03/2017   10:32 AM 06/28/2016   10:03 AM  Advanced Directives  Does Patient Have a Medical Advance Directive? No Yes No No No No  Type of Special educational needs teacher of [place];Living will      Copy of Healthcare Power of Attorney in Chart?  No - copy requested      Would patient like information on creating a medical advance directive? No - Patient declined  No - Patient declined Yes (MAU/Ambulatory/Procedural Areas - Information given) Yes (MAU/Ambulatory/Procedural Areas - Information given) Yes (MAU/Ambulatory/Procedural Areas - Information given)    Current Medications (verified) Outpatient Encounter Medications as of 08/13/2023  Medication Sig   carboxymethylcellulose (REFRESH PLUS) 0.5 % SOLN Place 1 drop into both eyes 3 (three) times daily as needed.   Light Mineral Oil-Mineral Oil (RETAINE MGD OP) Apply  to eye.   metFORMIN  (GLUCOPHAGE -XR) 500 MG 24 hr tablet TAKE 1 TABLET BY MOUTH EVERY DAY WITH BREAKFAST   Multiple Vitamins-Minerals (ALIVE CALCIUM BONE SUPPORT PO) Take by mouth.   Multiple Vitamins-Minerals (HAIR SKIN & NAILS PO) Take by mouth.   Multiple Vitamins-Minerals (PRESERVISION AREDS 2 PO) Take by mouth.   nystatin  cream (MYCOSTATIN ) APPLY TO AFFECTED AREA TWICE A DAY   dexamethasone  (DECADRON ) 1 MG/ML solution Take by mouth daily. (Patient not taking: Reported on 08/13/2023)   No facility-administered encounter medications on file as of 08/13/2023.    Allergies (verified) Macrodantin [nitrofurantoin macrocrystal] and Silver sulfadiazine   History: Past Medical History:  Diagnosis Date   Diabetes mellitus without complication (HCC)    diet controlled   Heart murmur    Kidney stones    Macular degeneration    UTI (lower urinary tract infection)    Past Surgical History:  Procedure Laterality Date   ABDOMINAL HYSTERECTOMY     cataracts     EYE SURGERY  10/28/2019   catarat surgery left eye   EYE SURGERY  10/19/2019   catarat surgery right eye   LITHOTRIPSY     Family History  Problem Relation Age of Onset   Macular degeneration Mother    Hypertension Father    Glaucoma Sister    Hypertension Sister    Heart disease Brother    Panic disorder Son    Social History   Socioeconomic History   Marital status: Married    Spouse name: Not on file   Number of children: 2   Years of  education: Not on file   Highest education level: Not on file  Occupational History   Occupation: Retired     Comment: data entry   Tobacco Use   Smoking status: Never   Smokeless tobacco: Never  Vaping Use   Vaping status: Never Used  Substance and Sexual Activity   Alcohol use: Yes   Drug use: No   Sexual activity: Not Currently  Other Topics Concern   Not on file  Social History Narrative   2 Sons    4 Grandchildren (girls)    Hobbies- Social research officer, government, spending time with  cousin    Social Drivers of Corporate investment banker Strain: Low Risk  (08/13/2023)   Overall Financial Resource Strain (CARDIA)    Difficulty of Paying Living Expenses: Not hard at all  Food Insecurity: No Food Insecurity (08/13/2023)   Hunger Vital Sign    Worried About Running Out of Food in the Last Year: Never true    Ran Out of Food in the Last Year: Never true  Transportation Needs: No Transportation Needs (08/13/2023)   PRAPARE - Administrator, Civil Service (Medical): No    Lack of Transportation (Non-Medical): No  Physical Activity: Insufficiently Active (08/13/2023)   Exercise Vital Sign    Days of Exercise per Week: 2 days    Minutes of Exercise per Session: 20 min  Stress: No Stress Concern Present (08/13/2023)   [employer] of Occupational Health - Occupational Stress Questionnaire    Feeling of Stress : Not at all  Social Connections: Moderately Integrated (08/13/2023)   Social Connection and Isolation Panel [NHANES]    Frequency of Communication with Friends and Family: Three times a week    Frequency of Social Gatherings with Friends and Family: More than three times a week    Attends Religious Services: Never    Database administrator or Organizations: Yes    Attends Engineer, structural: More than 4 times per year    Marital Status: Married    Tobacco Counseling Counseling given: Not Answered   Clinical Intake:  Pre-visit preparation completed: Yes  Pain : No/denies pain     Diabetes: Yes CBG done?: No Did pt. bring in CBG monitor from home?: No  How often do you need to have someone help you when you read instructions, pamphlets, or other written materials from your doctor or pharmacy?: 1 - Never  Interpreter Needed?: No  Information entered by :: Kieth Pelt LPN   Activities of Daily Living    08/13/2023   11:43 AM  In your present state of health, do you have any difficulty performing the following activities:   Hearing? 0  Vision? 0  Difficulty concentrating or making decisions? 0  Walking or climbing stairs? 0  Dressing or bathing? 0  Doing errands, shopping? 0  Preparing Food and eating ? N  Using the Toilet? N  In the past six months, have you accidently leaked urine? N  Do you have problems with loss of bowel control? N  Managing your Medications? N  Managing your Finances? N  Housekeeping or managing your Housekeeping? N    Patient Care Team: Jess Morita, MD as PCP - General (Family Medicine) Sedalia Dacosta, MD as Referring Physician (Internal Medicine) Jerl Montgomery, MD as Referring Physician (Urology) Rosario Commons, MD as Referring Physician (Optometry) Lesta Rater, Jodie Munson, MD as Referring Physician (Ophthalmology) Lorry Rouge, MD as Referring Physician (Dermatology)  Indicate any recent Medical  Services you may have received from other than [facility] providers in the past year (date may be approximate).     Assessment:    This is a routine wellness examination for Luis.  Hearing/Vision screen Hearing Screening - Comments:: No trouble hearing Vision Screening - Comments:: Up to date [facility]   Goals Addressed             This Visit's Progress    Patient Stated   On track    Eat healthier     Patient Stated   On track    Continue current lifestyle     Patient Stated       Maintain current lifestyle       Depression Screen    08/13/2023   11:48 AM 06/05/2023    8:17 AM 08/01/2022    9:35 AM 06/28/2022   11:48 AM 04/10/2022    9:17 AM 12/12/2021   10:02 AM 08/17/2021   11:05 AM  PHQ 2/9 Scores  PHQ - 2 Score 2 0 0 0 0 0 0  PHQ- 9 Score 3 0 0 0 0 0     Fall Risk    08/13/2023   11:38 AM 06/05/2023    8:17 AM 03/13/2023    9:33 AM 08/01/2022    9:35 AM 06/28/2022   11:40 AM  Fall Risk   Falls in the past year? 0 0 0 0 0  Number falls in past yr: 0 0 0 0 0  Injury with Fall? 0 0 0 0 0  Risk for fall due to :  No Fall Risks No Fall  Risks No Fall Risks   Follow up Falls evaluation completed;Education provided;Falls prevention discussed Falls evaluation completed Falls evaluation completed Falls evaluation completed Falls evaluation completed;Education provided;Falls prevention discussed    MEDICARE RISK AT HOME: Medicare Risk at Home Any stairs in or around the home?: No If so, are there any without handrails?: No Home free of loose throw rugs in walkways, pet beds, electrical cords, etc?: Yes Adequate lighting in your home to reduce risk of falls?: Yes Life alert?: No Use of a cane, walker or w/c?: No Grab bars in the bathroom?: Yes Shower chair or bench in shower?: Yes Elevated toilet seat or a handicapped toilet?: Yes  TIMED UP AND GO:  Was the test performed?  No    Cognitive Function:    07/03/2017   10:34 AM  MMSE - Mini Mental State Exam  Orientation to time 5  Orientation to Place 5  Registration 3  Attention/ Calculation 3  Recall 3  Language- name 2 objects 2  Language- repeat 1  Language- follow 3 step command 3  Language- read & follow direction 1  Write a sentence 1  Copy design 1  Total score 28        08/13/2023   11:44 AM 06/28/2022   11:44 AM 07/01/2019   11:25 AM  6CIT Screen  What Year? 0 points 0 points 0 points  What month? 0 points 0 points 0 points  What time? 0 points 0 points 0 points  Count back from 20 0 points 2 points 0 points  Months in reverse 0 points 0 points 0 points  Repeat phrase 0 points 2 points 0 points  Total Score 0 points 4 points 0 points    Immunizations Immunization History  Administered Date(s) Administered   Moderna SARS-COV2 Booster Vaccination 12/31/2019   Moderna Sars-Covid-2 Vaccination 05/17/2019, 06/17/2019   Pneumococcal Conjugate-13  11/30/2015   Pneumococcal Polysaccharide-23 07/03/2017    TDAP status: Due, Education has been provided regarding the importance of this vaccine. Advised may receive this vaccine at local pharmacy or  Health Dept. Aware to provide a copy of the vaccination record if obtained from local pharmacy or Health Dept. Verbalized acceptance and understanding.  Flu Vaccine status: Declined, Education has been provided regarding the importance of this vaccine but patient still declined. Advised may receive this vaccine at local pharmacy or Health Dept. Aware to provide a copy of the vaccination record if obtained from local pharmacy or Health Dept. Verbalized acceptance and understanding.  Pneumococcal vaccine status: Up to date  Covid-19 vaccine status: Declined, Education has been provided regarding the importance of this vaccine but patient still declined. Advised may receive this vaccine at local pharmacy or Health Dept.or vaccine clinic. Aware to provide a copy of the vaccination record if obtained from local pharmacy or Health Dept. Verbalized acceptance and understanding.  Qualifies for Shingles Vaccine? Yes   Zostavax completed No   Shingrix Completed?: No.    Education has been provided regarding the importance of this vaccine. Patient has been advised to call insurance company to determine out of pocket expense if they have not yet received this vaccine. Advised may also receive vaccine at local pharmacy or Health Dept. Verbalized acceptance and understanding.  Screening Tests Health Maintenance  Topic Date Due   Hepatitis C Screening  Never done   DTaP/Tdap/Td (1 - Tdap) Never done   MAMMOGRAM  05/01/2023   COVID-19 Vaccine (4 - 2024-25 season) 08/29/2023 (Originally 11/04/2022)   Zoster Vaccines- Shingrix (1 of 2) 11/13/2023 (Originally 11/03/1994)   Fecal DNA (Cologuard)  08/12/2024 (Originally 03/12/2023)   INFLUENZA VACCINE  10/04/2023   HEMOGLOBIN A1C  12/05/2023   OPHTHALMOLOGY EXAM  03/04/2024   FOOT EXAM  03/12/2024   Diabetic kidney evaluation - Urine ACR  03/14/2024   DEXA SCAN  04/30/2024   Diabetic kidney evaluation - eGFR measurement  06/04/2024   Medicare Annual Wellness (AWV)   08/12/2024   Pneumonia Vaccine 38+ Years old  Completed   HPV VACCINES  Aged Out   Meningococcal B Vaccine  Aged Out    Health Maintenance  Health Maintenance Due  Topic Date Due   Hepatitis C Screening  Never done   DTaP/Tdap/Td (1 - Tdap) Never done   MAMMOGRAM  05/01/2023    Colorectal cancer screening: No longer required.   Mammogram status: Completed  . Repeat every year  Bone Density status: Completed 2024. Results reflect: Bone density results: OSTEOPENIA. Repeat every 3 years.  Lung Cancer Screening: (Low Dose CT Chest recommended if Age 64-80 years, 20 pack-year currently smoking OR have quit w/in 15years.)  qualify.   Lung Cancer Screening Referral:   Additional Screening:  Hepatitis C Screening never done  Vision Screening: Recommended annual ophthalmology exams for early detection of glaucoma and other disorders of the eye. Is the patient up to date with their annual eye exam?  Yes  Who is the provider or what is the name of the office in which the patient attends annual eye exams? [facility] If pt is not established with a provider, would they like to be referred to a provider to establish care? No .   Dental Screening: Recommended annual dental exams for proper oral hygiene  Nutrition Risk Assessment:  Has the patient had any N/V/D within the last 2 months?  No  Does the patient have any non-healing wounds?  No  Has the patient had any unintentional weight loss or weight gain?  No   Diabetes:  Is the patient diabetic?  Yes  If diabetic, was a CBG obtained today?  No  Did the patient bring in their glucometer from home?  Yes  How often do you monitor your CBG's? Every 3 months.   Financial Strains and Diabetes Management:  Are you having any financial strains with the device, your supplies or your medication? No .  Does the patient want to be seen by Chronic Care Management for management of their diabetes?  No  Would the patient like to be  referred to a Nutritionist or for Diabetic Management?  No   Diabetic Exams:  Diabetic Eye Exam: Completed .   Diabetic Foot Exam: Completed . Pt has been advised about the importance in completing this exam..    Community Resource Referral / Chronic Care Management: CRR required this visit?  No   CCM required this visit?  No     Plan:     I have personally reviewed and noted the following in the patient's chart:   Medical and social history Use of alcohol, tobacco or illicit drugs  Current medications and supplements including opioid prescriptions. Patient is not currently taking opioid prescriptions. Functional ability and status Nutritional status Physical activity Advanced directives List of other physicians Hospitalizations, surgeries, and ER visits in previous 12 months Vitals Screenings to include cognitive, depression, and falls Referrals and appointments  In addition, I have reviewed and discussed with patient certain preventive protocols, quality metrics, and best practice recommendations. A written personalized care plan for preventive services as well as general preventive health recommendations were provided to patient.     Kieth Pelt, LPN   [id number]/[id number]   After Visit Summary: (MyChart) Due to this being a telephonic visit, the after visit summary with patients personalized plan was offered to patient via MyChart   Nurse Notes:

## 2023-08-13 NOTE — Patient Instructions (Signed)
 [name] , Thank you for taking time to come for your Medicare Wellness Visit. I appreciate your ongoing commitment to your health goals. Please review the following plan we discussed and let me know if I can assist you in the future.   Screening recommendations/referrals: Colonoscopy: no longer required Mammogram: up to date Bone Density: up to date Recommended yearly ophthalmology/optometry visit for glaucoma screening and checkup Recommended yearly dental visit for hygiene and checkup  Vaccinations: Influenza vaccine:   Pneumococcal vaccine: Tdap vaccine: up to date Shingles vaccine: Education provided    Advanced directives: Education provided     Preventive Care 65 Years and Older, Female Preventive care refers to lifestyle choices and visits with your health care provider that can promote health and wellness. What does preventive care include? A yearly physical exam. This is also called an annual well check. Dental exams once or twice a year. Routine eye exams. Ask your health care provider how often you should have your eyes checked. Personal lifestyle choices, including: Daily care of your teeth and gums. Regular physical activity. Eating a healthy diet. Avoiding tobacco and drug use. Limiting alcohol use. Practicing safe sex. Taking low-dose aspirin every day. Taking vitamin and mineral supplements as recommended by your health care provider. What happens during an annual well check? The services and screenings done by your health care provider during your annual well check will depend on your age, overall health, lifestyle risk factors, and family history of disease. Counseling  Your health care provider may ask you questions about your: Alcohol use. Tobacco use. Drug use. Emotional well-being. Home and relationship well-being. Sexual activity. Eating habits. History of falls. Memory and ability to understand (cognition). Work and work  Astronomer. Reproductive health. Screening  You may have the following tests or measurements: Height, weight, and BMI. Blood pressure. Lipid and cholesterol levels. These may be checked every 5 years, or more frequently if you are over 47 years old. Skin check. Lung cancer screening. You may have this screening every year starting at age 21 if you have a 30-pack-year history of smoking and currently smoke or have quit within the past 15 years. Fecal occult blood test (FOBT) of the stool. You may have this test every year starting at age 94. Flexible sigmoidoscopy or colonoscopy. You may have a sigmoidoscopy every 5 years or a colonoscopy every 10 years starting at age 73. Hepatitis C blood test. Hepatitis B blood test. Sexually transmitted disease (STD) testing. Diabetes screening. This is done by checking your blood sugar (glucose) after you have not eaten for a while (fasting). You may have this done every 1-3 years. Bone density scan. This is done to screen for osteoporosis. You may have this done starting at age 52. Mammogram. This may be done every 1-2 years. Talk to your health care provider about how often you should have regular mammograms. Talk with your health care provider about your test results, treatment options, and if necessary, the need for more tests. Vaccines  Your health care provider may recommend certain vaccines, such as: Influenza vaccine. This is recommended every year. Tetanus, diphtheria, and acellular pertussis (Tdap, Td) vaccine. You may need a Td booster every 10 years. Zoster vaccine. You may need this after age 34. Pneumococcal 13-valent conjugate (PCV13) vaccine. One dose is recommended after age 31. Pneumococcal polysaccharide (PPSV23) vaccine. One dose is recommended after age 34. Talk to your health care provider about which screenings and vaccines you need and how often you need them.  This information is not intended to replace advice given to you by  your health care provider. Make sure you discuss any questions you have with your health care provider. Document Released: 03/18/2015 Document Revised: 11/09/2015 Document Reviewed: 12/21/2014 Elsevier Interactive Patient Education  2017 [employer].  Fall Prevention in the Home Falls can cause injuries. They can happen to people of all ages. There are many things you can do to make your home safe and to help prevent falls. What can I do on the outside of my home? Regularly fix the edges of walkways and driveways and fix any cracks. Remove anything that might make you trip as you walk through a door, such as a raised step or threshold. Trim any bushes or trees on the path to your home. Use bright outdoor lighting. Clear any walking paths of anything that might make someone trip, such as rocks or tools. Regularly check to see if handrails are loose or broken. Make sure that both sides of any steps have handrails. Any raised decks and porches should have guardrails on the edges. Have any leaves, snow, or ice cleared regularly. Use sand or salt on walking paths during winter. Clean up any spills in your garage right away. This includes oil or grease spills. What can I do in the bathroom? Use night lights. Install grab bars by the toilet and in the tub and shower. Do not use towel bars as grab bars. Use non-skid mats or decals in the tub or shower. If you need to sit down in the shower, use a plastic, non-slip stool. Keep the floor dry. Clean up any water that spills on the floor as soon as it happens. Remove soap buildup in the tub or shower regularly. Attach bath mats securely with double-sided non-slip rug tape. Do not have throw rugs and other things on the floor that can make you trip. What can I do in the bedroom? Use night lights. Make sure that you have a light by your bed that is easy to reach. Do not use any sheets or blankets that are too big for your bed. They should not hang  down onto the floor. Have a firm chair that has side arms. You can use this for support while you get dressed. Do not have throw rugs and other things on the floor that can make you trip. What can I do in the kitchen? Clean up any spills right away. Avoid walking on wet floors. Keep items that you use a lot in easy-to-reach places. If you need to reach something above you, use a strong step stool that has a grab bar. Keep electrical cords out of the way. Do not use floor polish or wax that makes floors slippery. If you must use wax, use non-skid floor wax. Do not have throw rugs and other things on the floor that can make you trip. What can I do with my stairs? Do not leave any items on the stairs. Make sure that there are handrails on both sides of the stairs and use them. Fix handrails that are broken or loose. Make sure that handrails are as long as the stairways. Check any carpeting to make sure that it is firmly attached to the stairs. Fix any carpet that is loose or worn. Avoid having throw rugs at the top or bottom of the stairs. If you do have throw rugs, attach them to the floor with carpet tape. Make sure that you have a light switch at the  top of the stairs and the bottom of the stairs. If you do not have them, ask someone to add them for you. What else can I do to help prevent falls? Wear shoes that: Do not have high heels. Have rubber bottoms. Are comfortable and fit you well. Are closed at the toe. Do not wear sandals. If you use a stepladder: Make sure that it is fully opened. Do not climb a closed stepladder. Make sure that both sides of the stepladder are locked into place. Ask someone to hold it for you, if possible. Clearly mark and make sure that you can see: Any grab bars or handrails. First and last steps. Where the edge of each step is. Use tools that help you move around (mobility aids) if they are needed. These  include: Canes. Walkers. Scooters. Crutches. Turn on the lights when you go into a dark area. Replace any light bulbs as soon as they burn out. Set up your furniture so you have a clear path. Avoid moving your furniture around. If any of your floors are uneven, fix them. If there are any pets around you, be aware of where they are. Review your medicines with your doctor. Some medicines can make you feel dizzy. This can increase your chance of falling. Ask your doctor what other things that you can do to help prevent falls. This information is not intended to replace advice given to you by your health care provider. Make sure you discuss any questions you have with your health care provider. Document Released: 12/16/2008 Document Revised: 07/28/2015 Document Reviewed: 03/26/2014 Elsevier Interactive Patient Education  2017 [employer].

## 2023-12-01 ENCOUNTER — Other Ambulatory Visit: Payer: Self-pay | Admitting: Family Medicine

## 2023-12-01 DIAGNOSIS — E119 Type 2 diabetes mellitus without complications: Secondary | ICD-10-CM

## 2023-12-05 ENCOUNTER — Encounter: Payer: Self-pay | Admitting: Family Medicine

## 2023-12-05 ENCOUNTER — Ambulatory Visit: Admitting: Family Medicine

## 2023-12-05 VITALS — BP 138/70 | HR 64 | Temp 97.7°F | Wt 179.8 lb

## 2023-12-05 DIAGNOSIS — E781 Pure hyperglyceridemia: Secondary | ICD-10-CM

## 2023-12-05 DIAGNOSIS — Z7984 Long term (current) use of oral hypoglycemic drugs: Secondary | ICD-10-CM | POA: Diagnosis not present

## 2023-12-05 DIAGNOSIS — E119 Type 2 diabetes mellitus without complications: Secondary | ICD-10-CM | POA: Diagnosis not present

## 2023-12-05 DIAGNOSIS — E66811 Obesity, class 1: Secondary | ICD-10-CM | POA: Diagnosis not present

## 2023-12-05 DIAGNOSIS — Z6831 Body mass index (BMI) 31.0-31.9, adult: Secondary | ICD-10-CM

## 2023-12-05 LAB — MICROALBUMIN / CREATININE URINE RATIO
Creatinine,U: 44.6 mg/dL
Microalb Creat Ratio: UNDETERMINED mg/g (ref 0.0–30.0)
Microalb, Ur: <0.7 mg/dL

## 2023-12-05 LAB — CBC WITH DIFFERENTIAL/PLATELET
Basophils Absolute: 0.1 K/uL (ref 0.0–0.1)
Basophils Relative: 0.8 % (ref 0.0–3.0)
Eosinophils Absolute: 0.4 K/uL (ref 0.0–0.7)
Eosinophils Relative: 5.3 % — ABNORMAL HIGH (ref 0.0–5.0)
HCT: 43.2 % (ref 36.0–46.0)
Hemoglobin: 14.6 g/dL (ref 12.0–15.0)
Lymphocytes Relative: 31.1 % (ref 12.0–46.0)
Lymphs Abs: 2.2 K/uL (ref 0.7–4.0)
MCHC: 33.9 g/dL (ref 30.0–36.0)
MCV: 91.9 fl (ref 78.0–100.0)
Monocytes Absolute: 0.6 K/uL (ref 0.1–1.0)
Monocytes Relative: 8.7 % (ref 3.0–12.0)
Neutro Abs: 3.8 K/uL (ref 1.4–7.7)
Neutrophils Relative %: 54.1 % (ref 43.0–77.0)
Platelets: 273 K/uL (ref 150.0–400.0)
RBC: 4.7 Mil/uL (ref 3.87–5.11)
RDW: 13.2 % (ref 11.5–15.5)
WBC: 7.1 K/uL (ref 4.0–10.5)

## 2023-12-05 LAB — HEPATIC FUNCTION PANEL
ALT: 23 U/L (ref 0–35)
AST: 19 U/L (ref 0–37)
Albumin: 4.1 g/dL (ref 3.5–5.2)
Alkaline Phosphatase: 50 U/L (ref 39–117)
Bilirubin, Direct: 0.2 mg/dL (ref 0.0–0.3)
Total Bilirubin: 0.7 mg/dL (ref 0.2–1.2)
Total Protein: 7.3 g/dL (ref 6.0–8.3)

## 2023-12-05 LAB — LIPID PANEL
Cholesterol: 140 mg/dL (ref 0–200)
HDL: 53.8 mg/dL (ref >39.00)
LDL Cholesterol: 48 mg/dL (ref 0–99)
NonHDL: 86.06
Total CHOL/HDL Ratio: 3
Triglycerides: 189 mg/dL — ABNORMAL HIGH (ref 0.0–149.0)
VLDL: 37.8 mg/dL (ref 0.0–40.0)

## 2023-12-05 LAB — BASIC METABOLIC PANEL WITH GFR
BUN: 12 mg/dL (ref 6–23)
CO2: 27 meq/L (ref 19–32)
Calcium: 9.6 mg/dL (ref 8.4–10.5)
Chloride: 102 meq/L (ref 96–112)
Creatinine, Ser: 0.68 mg/dL (ref 0.40–1.20)
GFR: 83.03 mL/min (ref >60.00)
Glucose, Bld: 158 mg/dL — ABNORMAL HIGH (ref 70–99)
Potassium: 4.3 meq/L (ref 3.5–5.1)
Sodium: 138 meq/L (ref 135–145)

## 2023-12-05 LAB — HEMOGLOBIN A1C: Hgb A1c MFr Bld: 8.3 % — ABNORMAL HIGH (ref 4.6–6.5)

## 2023-12-05 LAB — TSH: TSH: 4.04 u[IU]/mL (ref 0.35–5.50)

## 2023-12-05 NOTE — Assessment & Plan Note (Signed)
 Ongoing issue.  Attempting to control w/ diet and exercise.  Check labs and determine if medication needed

## 2023-12-05 NOTE — Patient Instructions (Signed)
 Schedule your complete physical in 6 months We'll notify you of your lab results and make any changes if needed Continue to work on healthy diet and regular physical activity Call with any questions or concerns Stay Safe!  Stay Healthy! Happy Fall!!

## 2023-12-05 NOTE — Assessment & Plan Note (Signed)
 Ongoing issue.  Weight and BMI are stable.  Encouraged low carb diet, regular walking.  Will continue to follow.

## 2023-12-05 NOTE — Assessment & Plan Note (Signed)
 Chronic problem.  On Metformin  XR 500mg  daily w/o difficulty.  UTD on foot exam and eye exam.  Will get A1C and microalbumin today.  Adjust meds prn.

## 2023-12-05 NOTE — Progress Notes (Signed)
   Subjective:    Patient ID: Sandra Bailey, female    DOB: [birth year], 78 y.o.   MRN: [id number]  HPI DM- chronic problem.  On Metformin  XR 500mg  daily.  Last A1C 7.6%.  UTD on foot exam, eye exam.  Due for A1C and microalbumin.  No CP, SOB, HA's, abd pain, N/V.  Denies symptomatic lows.  No numbness/tingling of hands/feet.  Hypertriglyceridemia- not currently on medication.  Attempting to control w/ diet and physical activity.    Obesity- BMI stable at 31.85.  Limited physical activity.     Review of Systems For ROS see HPI     Objective:   Physical Exam Vitals reviewed.  Constitutional:      General: She is not in acute distress.    Appearance: Normal appearance. She is well-developed. She is obese. She is not ill-appearing.  HENT:     Head: Normocephalic and atraumatic.  Eyes:     Conjunctiva/sclera: Conjunctivae normal.     Pupils: Pupils are equal, round, and reactive to light.  Neck:     Thyroid : No thyromegaly.  Cardiovascular:     Rate and Rhythm: Normal rate and regular rhythm.     Pulses: Normal pulses.     Heart sounds: Normal heart sounds. No murmur heard. Pulmonary:     Effort: Pulmonary effort is normal. No respiratory distress.     Breath sounds: Normal breath sounds.  Abdominal:     General: There is no distension.     Palpations: Abdomen is soft.     Tenderness: There is no abdominal tenderness.  Musculoskeletal:     Cervical back: Normal range of motion and neck supple.     Right lower leg: No edema.     Left lower leg: No edema.  Lymphadenopathy:     Cervical: No cervical adenopathy.  Skin:    General: Skin is warm and dry.  Neurological:     General: No focal deficit present.     Mental Status: She is alert and oriented to person, place, and time.  Psychiatric:        Mood and Affect: Mood normal.        Behavior: Behavior normal.        Thought Content: Thought content normal.           Assessment & Plan:

## 2023-12-06 ENCOUNTER — Ambulatory Visit: Payer: Self-pay | Admitting: Family Medicine

## 2023-12-06 ENCOUNTER — Other Ambulatory Visit: Payer: Self-pay

## 2023-12-06 DIAGNOSIS — E119 Type 2 diabetes mellitus without complications: Secondary | ICD-10-CM

## 2023-12-06 MED ORDER — METFORMIN HCL ER 500 MG PO TB24: ORAL_TABLET | ORAL | 1 refills | Status: AC

## 2024-06-09 ENCOUNTER — Encounter: Admitting: Family Medicine

## 2024-08-18 ENCOUNTER — Encounter
